# Patient Record
Sex: Female | Born: 1975
Health system: Southern US, Community
[De-identification: ages and names within clinical notes are randomized; demographics above are authoritative.]

## PROBLEM LIST (undated history)

## (undated) DIAGNOSIS — K219 Gastro-esophageal reflux disease without esophagitis: Secondary | ICD-10-CM

## (undated) DIAGNOSIS — R51 Headache: Secondary | ICD-10-CM

## (undated) DIAGNOSIS — F329 Major depressive disorder, single episode, unspecified: Secondary | ICD-10-CM

## (undated) DIAGNOSIS — M797 Fibromyalgia: Secondary | ICD-10-CM

## (undated) DIAGNOSIS — A419 Sepsis, unspecified organism: Secondary | ICD-10-CM

## (undated) DIAGNOSIS — R21 Rash and other nonspecific skin eruption: Secondary | ICD-10-CM

## (undated) DIAGNOSIS — R011 Cardiac murmur, unspecified: Secondary | ICD-10-CM

## (undated) DIAGNOSIS — F909 Attention-deficit hyperactivity disorder, unspecified type: Secondary | ICD-10-CM

## (undated) DIAGNOSIS — F419 Anxiety disorder, unspecified: Secondary | ICD-10-CM

## (undated) DIAGNOSIS — N809 Endometriosis, unspecified: Secondary | ICD-10-CM

## (undated) DIAGNOSIS — F32A Depression, unspecified: Secondary | ICD-10-CM

## (undated) DIAGNOSIS — R519 Headache, unspecified: Secondary | ICD-10-CM

## (undated) DIAGNOSIS — D649 Anemia, unspecified: Secondary | ICD-10-CM

## (undated) DIAGNOSIS — I83813 Varicose veins of bilateral lower extremities with pain: Secondary | ICD-10-CM

## (undated) DIAGNOSIS — J45909 Unspecified asthma, uncomplicated: Secondary | ICD-10-CM

## (undated) HISTORY — DX: Anemia, unspecified: D64.9

## (undated) HISTORY — DX: Endometriosis, unspecified: N80.9

## (undated) HISTORY — DX: Rash and other nonspecific skin eruption: R21

## (undated) HISTORY — PX: CHOLECYSTECTOMY: SHX55

## (undated) HISTORY — DX: Anxiety disorder, unspecified: F41.9

## (undated) HISTORY — DX: Major depressive disorder, single episode, unspecified: F32.9

## (undated) HISTORY — DX: Fibromyalgia: M79.7

## (undated) HISTORY — DX: Depression, unspecified: F32.A

## (undated) HISTORY — DX: Cardiac murmur, unspecified: R01.1

---

## 2005-07-15 ENCOUNTER — Emergency Department (HOSPITAL_COMMUNITY): Admission: EM | Admit: 2005-07-15 | Discharge: 2005-07-15 | Payer: Self-pay | Admitting: Family Medicine

## 2007-04-30 ENCOUNTER — Ambulatory Visit: Payer: Self-pay | Admitting: Family Medicine

## 2007-08-18 ENCOUNTER — Ambulatory Visit: Payer: Self-pay | Admitting: Family Medicine

## 2010-06-20 ENCOUNTER — Ambulatory Visit: Payer: Self-pay | Admitting: Unknown Physician Specialty

## 2010-07-18 ENCOUNTER — Ambulatory Visit: Payer: Self-pay | Admitting: Unknown Physician Specialty

## 2011-03-18 ENCOUNTER — Other Ambulatory Visit: Payer: Self-pay | Admitting: Internal Medicine

## 2011-04-29 ENCOUNTER — Ambulatory Visit: Payer: Self-pay | Admitting: Pain Medicine

## 2011-05-22 ENCOUNTER — Ambulatory Visit: Payer: Self-pay | Admitting: Pain Medicine

## 2011-06-02 ENCOUNTER — Ambulatory Visit: Payer: Self-pay | Admitting: Pain Medicine

## 2011-06-04 ENCOUNTER — Ambulatory Visit: Payer: Self-pay | Admitting: Pain Medicine

## 2011-06-17 ENCOUNTER — Ambulatory Visit: Payer: Self-pay | Admitting: Pain Medicine

## 2011-07-09 ENCOUNTER — Ambulatory Visit: Payer: Self-pay | Admitting: Pain Medicine

## 2011-08-05 ENCOUNTER — Ambulatory Visit: Payer: Self-pay | Admitting: Pain Medicine

## 2011-08-20 ENCOUNTER — Ambulatory Visit: Payer: Self-pay | Admitting: Pain Medicine

## 2011-09-02 ENCOUNTER — Ambulatory Visit: Payer: Self-pay | Admitting: Pain Medicine

## 2011-09-30 ENCOUNTER — Ambulatory Visit: Payer: Self-pay | Admitting: Pain Medicine

## 2011-10-30 ENCOUNTER — Ambulatory Visit: Payer: Self-pay | Admitting: Pain Medicine

## 2011-12-01 ENCOUNTER — Ambulatory Visit: Payer: Self-pay | Admitting: Pain Medicine

## 2011-12-15 ENCOUNTER — Ambulatory Visit: Payer: Self-pay | Admitting: Pain Medicine

## 2011-12-30 ENCOUNTER — Ambulatory Visit: Payer: Self-pay | Admitting: Pain Medicine

## 2012-01-27 ENCOUNTER — Ambulatory Visit: Payer: Self-pay | Admitting: Pain Medicine

## 2012-02-06 ENCOUNTER — Ambulatory Visit: Payer: Self-pay | Admitting: Pain Medicine

## 2012-02-25 ENCOUNTER — Ambulatory Visit: Payer: Self-pay | Admitting: Pain Medicine

## 2012-03-23 ENCOUNTER — Ambulatory Visit: Payer: Self-pay | Admitting: Pain Medicine

## 2012-04-26 ENCOUNTER — Ambulatory Visit: Payer: Self-pay | Admitting: Pain Medicine

## 2012-05-27 ENCOUNTER — Ambulatory Visit: Payer: Self-pay | Admitting: Pain Medicine

## 2012-06-28 ENCOUNTER — Ambulatory Visit: Payer: Self-pay | Admitting: Pain Medicine

## 2012-07-27 ENCOUNTER — Ambulatory Visit: Payer: Self-pay | Admitting: Pain Medicine

## 2012-08-26 ENCOUNTER — Ambulatory Visit: Payer: Self-pay | Admitting: Pain Medicine

## 2012-09-23 ENCOUNTER — Ambulatory Visit: Payer: Self-pay | Admitting: Pain Medicine

## 2012-10-25 ENCOUNTER — Ambulatory Visit: Payer: Self-pay | Admitting: Pain Medicine

## 2012-11-23 ENCOUNTER — Ambulatory Visit: Payer: Self-pay | Admitting: Pain Medicine

## 2012-12-23 ENCOUNTER — Ambulatory Visit: Payer: Self-pay | Admitting: Pain Medicine

## 2013-01-18 ENCOUNTER — Ambulatory Visit: Payer: Self-pay | Admitting: Pain Medicine

## 2013-02-17 ENCOUNTER — Ambulatory Visit: Payer: Self-pay | Admitting: Pain Medicine

## 2013-03-23 ENCOUNTER — Ambulatory Visit: Payer: Self-pay | Admitting: Pain Medicine

## 2013-04-20 ENCOUNTER — Ambulatory Visit: Payer: Self-pay | Admitting: Pain Medicine

## 2013-05-16 ENCOUNTER — Ambulatory Visit: Payer: Self-pay | Admitting: Pain Medicine

## 2013-06-20 ENCOUNTER — Ambulatory Visit: Payer: Self-pay | Admitting: Pain Medicine

## 2013-07-20 ENCOUNTER — Ambulatory Visit: Payer: Self-pay | Admitting: Pain Medicine

## 2013-08-16 ENCOUNTER — Ambulatory Visit: Payer: Self-pay | Admitting: Pain Medicine

## 2013-08-30 ENCOUNTER — Ambulatory Visit: Payer: Self-pay | Admitting: Gastroenterology

## 2013-09-15 ENCOUNTER — Ambulatory Visit: Payer: Self-pay | Admitting: Pain Medicine

## 2013-10-18 ENCOUNTER — Ambulatory Visit: Payer: Self-pay | Admitting: Pain Medicine

## 2013-11-03 ENCOUNTER — Ambulatory Visit: Payer: Self-pay | Admitting: Surgery

## 2013-11-05 LAB — PATHOLOGY REPORT

## 2013-11-08 ENCOUNTER — Ambulatory Visit: Payer: Self-pay | Admitting: Pain Medicine

## 2013-12-08 ENCOUNTER — Ambulatory Visit: Payer: Self-pay | Admitting: Pain Medicine

## 2014-01-05 ENCOUNTER — Ambulatory Visit: Payer: Self-pay | Admitting: Pain Medicine

## 2014-02-06 ENCOUNTER — Ambulatory Visit: Payer: Self-pay | Admitting: Pain Medicine

## 2014-02-16 ENCOUNTER — Inpatient Hospital Stay: Payer: Self-pay | Admitting: Internal Medicine

## 2014-02-16 LAB — CBC
HCT: 33.1 % — ABNORMAL LOW (ref 35.0–47.0)
HGB: 10.9 g/dL — ABNORMAL LOW (ref 12.0–16.0)
MCH: 29 pg (ref 26.0–34.0)
MCHC: 32.7 g/dL (ref 32.0–36.0)
MCV: 89 fL (ref 80–100)
PLATELETS: 281 10*3/uL (ref 150–440)
RBC: 3.75 10*6/uL — ABNORMAL LOW (ref 3.80–5.20)
RDW: 16 % — AB (ref 11.5–14.5)
WBC: 21 10*3/uL — ABNORMAL HIGH (ref 3.6–11.0)

## 2014-02-16 LAB — COMPREHENSIVE METABOLIC PANEL
Albumin: 2.8 g/dL — ABNORMAL LOW (ref 3.4–5.0)
Alkaline Phosphatase: 189 U/L — ABNORMAL HIGH
Anion Gap: 9 (ref 7–16)
BUN: 13 mg/dL (ref 7–18)
Bilirubin,Total: 0.3 mg/dL (ref 0.2–1.0)
CALCIUM: 8.1 mg/dL — AB (ref 8.5–10.1)
Chloride: 103 mmol/L (ref 98–107)
Co2: 24 mmol/L (ref 21–32)
Creatinine: 0.97 mg/dL (ref 0.60–1.30)
EGFR (African American): >60
EGFR (Non-African Amer.): >60
GLUCOSE: 104 mg/dL — AB (ref 65–99)
Osmolality: 272 (ref 275–301)
POTASSIUM: 2.7 mmol/L — AB (ref 3.5–5.1)
SGOT(AST): 22 U/L (ref 15–37)
SGPT (ALT): 21 U/L
Sodium: 136 mmol/L (ref 136–145)
Total Protein: 7 g/dL (ref 6.4–8.2)

## 2014-02-16 LAB — URINALYSIS, COMPLETE
Bilirubin,UR: NEGATIVE
Glucose,UR: NEGATIVE mg/dL (ref 0–75)
Nitrite: NEGATIVE
Ph: 5 (ref 4.5–8.0)
Protein: 30
RBC,UR: 8 /HPF (ref 0–5)
Specific Gravity: 1.014 (ref 1.003–1.030)
WBC UR: 18 /HPF (ref 0–5)

## 2014-02-17 LAB — BASIC METABOLIC PANEL
ANION GAP: 6 — AB (ref 7–16)
BUN: 10 mg/dL (ref 7–18)
CALCIUM: 7.6 mg/dL — AB (ref 8.5–10.1)
CO2: 24 mmol/L (ref 21–32)
Chloride: 110 mmol/L — ABNORMAL HIGH (ref 98–107)
Creatinine: 1.02 mg/dL (ref 0.60–1.30)
EGFR (African American): >60
EGFR (Non-African Amer.): >60
Glucose: 114 mg/dL — ABNORMAL HIGH (ref 65–99)
Osmolality: 279 (ref 275–301)
Potassium: 3.2 mmol/L — ABNORMAL LOW (ref 3.5–5.1)
Sodium: 140 mmol/L (ref 136–145)

## 2014-02-17 LAB — CBC WITH DIFFERENTIAL/PLATELET
Basophil #: 0 10*3/uL (ref 0.0–0.1)
Basophil %: 0.1 %
Eosinophil #: 0 10*3/uL (ref 0.0–0.7)
Eosinophil %: 0 %
HCT: 31.1 % — ABNORMAL LOW (ref 35.0–47.0)
HGB: 9.6 g/dL — ABNORMAL LOW (ref 12.0–16.0)
Lymphocyte #: 1 10*3/uL (ref 1.0–3.6)
Lymphocyte %: 6.3 %
MCH: 28.1 pg (ref 26.0–34.0)
MCHC: 31 g/dL — AB (ref 32.0–36.0)
MCV: 91 fL (ref 80–100)
MONO ABS: 1.4 x10 3/mm — AB (ref 0.2–0.9)
MONOS PCT: 9.5 %
Neutrophil #: 12.8 10*3/uL — ABNORMAL HIGH (ref 1.4–6.5)
Neutrophil %: 84.1 %
Platelet: 223 10*3/uL (ref 150–440)
RBC: 3.43 10*6/uL — ABNORMAL LOW (ref 3.80–5.20)
RDW: 16.3 % — ABNORMAL HIGH (ref 11.5–14.5)
WBC: 15.2 10*3/uL — ABNORMAL HIGH (ref 3.6–11.0)

## 2014-02-17 LAB — RAPID INFLUENZA A&B ANTIGENS

## 2014-02-18 LAB — URINE CULTURE

## 2014-02-21 LAB — CULTURE, BLOOD (SINGLE)

## 2014-03-09 ENCOUNTER — Ambulatory Visit: Payer: Self-pay | Admitting: Pain Medicine

## 2014-04-10 ENCOUNTER — Ambulatory Visit: Payer: Self-pay | Admitting: Pain Medicine

## 2014-05-09 ENCOUNTER — Ambulatory Visit: Payer: Self-pay | Admitting: Pain Medicine

## 2014-06-08 ENCOUNTER — Ambulatory Visit: Payer: Self-pay | Admitting: Pain Medicine

## 2014-07-04 ENCOUNTER — Ambulatory Visit: Payer: Self-pay | Admitting: Pain Medicine

## 2014-08-03 ENCOUNTER — Ambulatory Visit: Payer: Self-pay | Admitting: Pain Medicine

## 2014-09-05 ENCOUNTER — Ambulatory Visit
Admit: 2014-09-05 | Disposition: A | Discharge status: Home / Self Care | Payer: Self-pay | Attending: Pain Medicine | Admitting: Pain Medicine

## 2014-09-09 NOTE — Discharge Summary (Signed)
PATIENT NAME:  Isabel Robinson, Isabel Robinson MR#:  [id number] DATE OF BIRTH:  [birth year]  DATE OF ADMISSION:  02/16/2014 DATE OF DISCHARGE:  02/18/2014  ADMISSION DIAGNOSIS: Sepsis secondary to urinary tract infection.   DISCHARGE DIAGNOSES: 1. Sepsis secondary to right-sided pyelonephritis, acute.  2. Right-sided acute pyelonephritis. 3. Fibromyalgia. 4. Anxiety and depression.   CONSULTATIONS: None.   PERTINENT LABORATORIES: Blood and urine cultures are negative to date.   HOSPITAL COURSE: A 39 year old female who presented with chills and fever. For further details, please refer to the H and P.  1. Sepsis. The patient was admitted for fevers and chills. She had a UA performed at urgent care, which was positive. At the urgent care, she was given antibiotics. She came to our ER due to persistent fevers. She was noted to have sepsis with tachycardia and fevers on admission. This is secondary to acute pyelonephritis. Her blood cultures and urine culture are negative to date; however, she had received antibiotics prior to these cultures taken that she received in the urgent care.  2. Acute pyelonephritis. I expect a few days of fevers, despite adequate treatment. We are unable to get a urine culture. She was on Rocephin here and will be discharged with p.o. Augmentin.  3. Fibromyalgia. The patient can follow up with Dr. Metta Clinesrisp.  4. Anxiety or depression. The patient will continue her home medications.   DISCHARGE MEDICATIONS: 1. Topamax 25 mg b.i.d.  2. Tramadol 50 mg every 4 hours p.r.n.   3. Gabapentin 100 mg 2 tablets b.i.d.  4. Omeprazole 20 mg daily.  5. Acetaminophen/oxycodone 325/7.5 every 4 to 8 hours p.r.n. pain.  6. Orphenadrine 100 mg 1 to 2 tablets p.r.n. pain.  7. Effexor 75 mg b.i.d.  8. Diazepam 5 mg b.i.d.  9. Augmentin 1 tablet p.o. b.i.d. x 11 days.  10. Ambien 5 mg at bedtime p.r.n.   DISCHARGE DIET: Regular diet.   DISCHARGE ACTIVITY: As tolerated.   DISCHARGE FOLLOWUP: The  patient will follow up in 2 weeks with Dr. Juel BurrowMasoud, after completion of her antibiotic.   TIME SPENT: Approximately 35 minutes. The patient was stable for discharge.     ____________________________ Geordie Nooney P. Juliene PinaMody, MD spm:TT D: 02/18/2014 11:57:10 ET T: 02/18/2014 19:41:34 ET JOB#: [id number]  cc: Pearlena Ow P. Juliene PinaMody, MD, <Dictator> Ancil LinseyGregory H. Crisp, MD Corky DownsJaved Masoud, MD Janyth ContesSITAL P Annabella Elford MD ELECTRONICALLY SIGNED 02/18/2014 20:33

## 2014-09-09 NOTE — Op Note (Signed)
PATIENT NAME:  Isabel Robinson, Isabel Robinson DATE OF BIRTH:  1976-04-21  DATE OF PROCEDURE:  11/03/2013  PREOPERATIVE DIAGNOSIS: Cholecystitis and cholelithiasis.   POSTOPERATIVE DIAGNOSIS: Cholecystitis and cholelithiasis.   OPERATION: Robotic-assisted laparoscopic cholecystectomy, single site.  SURGEON: Quentin Orealph L. Ely III, MD  ANESTHESIA: General.  OPERATIVE PROCEDURE: With the patient in the supine position, after the induction of the appropriate general anesthesia, the patient's abdomen was prepped with ChloraPrep and draped with sterile towels. An umbilical incision was made from below the umbilicus to above the umbilicus at approximately 30 mm of skin incision. The incision was taken down to the fascia, which was divided with Bovie electrocautery. The peritoneum was identified and opened bluntly and then extended the length of the fascial incision. About 25 mm of fascial incision was encountered. The robot port was then inserted without difficulty. The abdomen was insufflated and the camera inserted through the camera port and the abdomen inspected. The gallbladder appeared to be chronically diseased. The camera ports 2 and 1 were inserted under direct vision. The robot was then brought to the table and appropriately docked, instruments inserted under direct vision. Using the assistance port, the gallbladder was lifted superiorly and laterally. Dissection was carried along the hepatoduodenal ligament. The cystic artery and cystic duct were identified. The cystic duct was clipped and divided as was the cystic artery. The gallbladder was then dissected free using the hook and cautery apparatus. Once the gallbladder was free, the camera was used to remove the ports under direct vision, and then the port site and gallbladder removed as 1 piece. The midline fascia was closed with interrupted figure-of-eight suture of 0 Maxon. The skin was closed with 5-0 nylon in vertical mattress fashion. The area  was injected with 0.25% Marcaine for postoperative pain control. Sterile dressings were applied. The patient was returned to the recovery room, having tolerated the procedure well. Sponge, instrument and needle counts were correct x2 in the operating room.   ____________________________ Quentin Orealph L. Ely III, MD rle:lb D: 11/03/2013 11:23:37 ET T: 11/03/2013 11:29:55 ET JOB#: [id number]  cc: Quentin Orealph L. Ely III, MD, <Dictator> Corky DownsJaved Masoud, MD Quentin OreALPH L ELY MD ELECTRONICALLY SIGNED 11/04/2013 18:13

## 2014-09-09 NOTE — H&P (Signed)
PATIENT NAME:  Isabel Robinson, Isabel Robinson MR#:  [id number] DATE OF BIRTH:  Oct 29, 1975  DATE OF ADMISSION:  02/16/2014  PRIMARY CARE PHYSICIAN:    REFERRING PHYSICIAN:  Cecille Amsterdam. Mayford Knife, MD  CHIEF COMPLAINT:  Flank pain, fever, generalized body aches.   HISTORY OF PRESENT ILLNESS:  Ms. Isabel Robinson is a 39 year old female who has history of fibromyalgia and neuropathy, who came to the Emergency Department with the above complaints. The patient states she had heavy menstruation about a week back and about 2 days later started to experience severe generalized weakness and generalized body aches. Since last night, the patient noticed to have fever of 103. She has some dysuria, abdominal pain, and back pain. Concerning this, she came to the Emergency Department.   WORKUP IN THE EMERGENCY DEPARTMENT:  The patient was found to have a urinary tract infection. The patient was found to have fever of 101.3. The patient was also found to have elevated white blood cell count of 16,000. UA shows trace leukocyte esterase and WBC of 18. Urine cultures have been obtained. The patient was given Rocephin in the Emergency Department.  PAST MEDICAL HISTORY:   1.  Anemia. 2.  History of asthma. 3.  Depression. 4.  Anxiety. 5.  Migraine headaches. 6.  Fibromyalgia.  PAST SURGICAL HISTORY:  Colonoscopy and cholecystectomy.  ALLERGIES:  No known drug allergies.  HOME MEDICATIONS:   1.  Venlafaxine 75 mg 2 times a day. 2.  Tramadol 50 mg every 4 hours as needed. 3.   25 mg 2 times a day. 4.   100 mg 2 times a day. 5.  Omeprazole 20 mg 2 times a day. 6.  Gabapentin 100 mg 2 times a day. 7.   5 mg 2 times a day. 8.  Percocet 7.5/325 mg every 4 to 8 hours as needed.  SOCIAL HISTORY:  Quit smoking about 3 months back. Denies drinking alcohol or using illicit drugs. Works as a Chartered loss adjuster. Married and lives with her husband.  FAMILY HISTORY:  Borderline diabetes mellitus.  REVIEW OF SYSTEMS: CONSTITUTIONAL:   Experiencing generalized weakness. EYES:  No change in vision. EARS, NOSE, AND THROAT:  No change in hearing. RESPIRATORY:  No cough or shortness of breath. CARDIOVASCULAR:  No chest pain or palpitations.  GASTROINTESTINAL:  There is no nausea, vomiting, or abdominal pain. GENITOURINARY:  Has frequency of urination and mild dysuria.  SKIN:  No rash or lesions. MUSCULOSKELETAL:  No joint pains. NEUROLOGICAL:  No weakness or numbness in any part of the body.  PHYSICAL EXAMINATION: GENERAL:  This is a well-built, well-nourished, age-appropriate female lying down in the bed not in distress. VITAL SIGNS:  Temperature 101.2, pulse 107, blood pressure 99/52, respiratory rate 18, oxygen saturation 98% on room air. HEENT:  Normocephalic, atraumatic. There is no scleral icterus. Conjunctivae are normal. Pupils are equal and reactive. Mucous membranes are moist without any erythema.  NECK:  Supple. No lymphadenopathy.  No JVD. No carotid bruit. CHEST:  No focal tenderness. Decreased breath sounds. Bilaterally clear to auscultation.  HEART: . No murmurs are heard. ABDOMEN:  Bowel sounds are present. Soft, nontender, nondistended, mild tenderness in the suprapubic area. The patient states she has right CVA tenderness. No hepatosplenomegaly. EXTREMITIES:  No pedal edema. Pulses are 2+. NEUROLOGIC:  The patient is alert and oriented to place, person, and time. Cranial nerves II through XII are intact. Motor is 5/5 in upper and lower extremities.   LABORATORY DATA:  CBC: WBC of 21,000, hemoglobin 10.9, platelet  count of 289. CMP: Potassium of 2.7 and was otherwise within normal limits. UA:  Trace leukocyte esterase, WBCs of 18, 1+ bacteria, and was otherwise within normal limits.  ASSESSMENT AND PLAN: Ms. Isabel Robinson is a 39 year old female who comes in with sepsis secondary to urinary tract infection.  1.  Sepsis secondary to urinary tract infection. The patient has mild urinary tract infection, does not have  any focal signs. However, there is also a possibility that the patient may be having viral fever. Will continue with the Rocephin and follow up with blood and urine cultures. 2.  Urinary tract infection. Follow up with urine cultures. Continue the Rocephin. 3.  Hyperkalemia.  Replace by mouth. 4.  Fibromyalgia.  Continue the home medications. 5.  Keep the patient on deep vein thrombosis prophylaxis with Lovenox.  TIME SPENT:  50 minutes.   ____________________________ Susa GriffinsPadmaja Juel Bellerose, MD pv:nb D: 02/16/2014 23:31:29 ET T: 02/16/2014 23:41:17 ET JOB#: [id number]  cc: Susa GriffinsPadmaja Josua Ferrebee, MD, <Dictator> Susa GriffinsPADMAJA Shepherd Finnan MD ELECTRONICALLY SIGNED 02/17/2014 23:01

## 2014-09-14 ENCOUNTER — Other Ambulatory Visit: Payer: Self-pay | Admitting: Internal Medicine

## 2014-09-15 ENCOUNTER — Ambulatory Visit
Admit: 2014-09-15 | Disposition: A | Discharge status: Home / Self Care | Payer: Self-pay | Attending: Internal Medicine | Admitting: Internal Medicine

## 2014-09-28 ENCOUNTER — Telehealth: Payer: Self-pay

## 2014-09-28 NOTE — Telephone Encounter (Signed)
Patient has headache. Please have Olegario MessierKathy and Morrie Sheldonshley patient to Monday scheduled. May consider sphenopalatine ganglion block. Please call patient to inform patient to bring a driver since may perform procedure

## 2014-09-28 NOTE — Telephone Encounter (Signed)
Patient has had a migraine since Tuesday and would like to come in as soon as possible. She has an appointment next Thursday but would like to come in sooner due to her headache.

## 2014-10-02 ENCOUNTER — Encounter: Payer: Self-pay | Admitting: Pain Medicine

## 2014-10-02 ENCOUNTER — Ambulatory Visit: Payer: BC Managed Care – PPO | Attending: Pain Medicine | Admitting: Pain Medicine

## 2014-10-02 ENCOUNTER — Encounter (INDEPENDENT_AMBULATORY_CARE_PROVIDER_SITE_OTHER): Payer: Self-pay

## 2014-10-02 VITALS — BP 120/75 | HR 92 | Temp 98.2°F | Resp 16 | Ht 64.0 in | Wt 150.0 lb

## 2014-10-02 DIAGNOSIS — R51 Headache: Secondary | ICD-10-CM | POA: Diagnosis present

## 2014-10-02 DIAGNOSIS — G43909 Migraine, unspecified, not intractable, without status migrainosus: Secondary | ICD-10-CM | POA: Insufficient documentation

## 2014-10-02 DIAGNOSIS — M5481 Occipital neuralgia: Secondary | ICD-10-CM

## 2014-10-02 DIAGNOSIS — M7060 Trochanteric bursitis, unspecified hip: Secondary | ICD-10-CM | POA: Diagnosis not present

## 2014-10-02 DIAGNOSIS — G43009 Migraine without aura, not intractable, without status migrainosus: Secondary | ICD-10-CM

## 2014-10-02 DIAGNOSIS — M533 Sacrococcygeal disorders, not elsewhere classified: Secondary | ICD-10-CM

## 2014-10-02 MED ORDER — OXYCODONE HCL 10 MG PO TABS: ORAL_TABLET | ORAL | Status: DC

## 2014-10-02 NOTE — Progress Notes (Signed)
Discharged to home ambulatory with oxycodone prescription.  Patient instructed to follow up with neurologist.

## 2014-10-02 NOTE — Progress Notes (Signed)
   Subjective:    Patient ID: Isabel Robinson, female    DOB: 1975-11-17, 39 y.o.   MRN: [id number]  HPI  Patient is 39 year old female [facility] and treatment of pain consisting of headache. Patient states she has had migraine headache over the weekend which has began to the patient would plan to proceed with interventional treatment and will avoid interventional treatment at this time since patient's headache is beginning to the patient. The patient is understanding and in agreement with suggested treatment plan. We have recommended patient undergo neurological evaluation for further assessment of her headaches and we will continue medications as prescribed and will remain available to consider interventional treatment should the headache difficult degree. The patient was understanding and in agreement with suggested treatment plan  Review of Systems     Objective:   Physical Exam     there was tenderness of the apathy and occipital muscle region with no bounding pulsations of the temporal region noted. The face appeared symmetrical without ptosis of the eyelids. No excessive tends to palpation of the sinuses were noted. There was minimal tenderness of the temporomandibular joint region. Palpation of the queasiness levator scapula rhomboid musculature regions were with tenderness to palpation of mild degree with mild tenderness of the thoracic paraspinal musculature region palpation over the lumbar paraspinal musculature region was a tends to palpation of mild degree palpation of the lumbar facets was with mild tenderness to palpation leg raising was limited to approximately 30. There was tenderness of the PSIS and PII S region of mild degree. Entry deficit of dermatomal distribution of the lower extremities noted. Tinel's and Phalen's maneuver were without increase of pain of significant degree and patient was with bilaterally equal grip strength  abdomen nontender no costovertebral tenderness noted    Assessment  Migraine headache  Occipital neuralgia  Sacroiliac joint dysfunction  Greater trochanteric bursitis   Plan   Continue present medications Norflex and oxycodone  F/U PCP for evaliation of  BP and general medical  condition.  F/U surgical evaluation.  F/U nrurological evaluation.  May consider radiofrequency rhizolysis or intraspinal procedures pending response to present treatment and F/U evaluation.  [facility] to call [facility] should patient have concerns prior to scheduled return appointment.              Assessment & Plan:

## 2014-10-02 NOTE — Patient Instructions (Addendum)
Continue present medications.  F/U PCP for evaliation of  BP and general medical  condition.  F/U surgical evaluation.  F/U nrurological evaluation for eval of migraine and general neurological evaluation is discussed  May consider radiofrequency rhizolysis or intraspinal procedures pending response to present treatment and F/U evaluation.  Patient to call [facility] should patient have concerns prior to scheduled return appointment.

## 2014-10-02 NOTE — Progress Notes (Signed)
   Subjective:    Patient ID: Isabel Robinson, female    DOB: 1975/06/21, 39 y.o.   MRN: [id number]  HPI    Review of Systems     Objective:   Physical Exam        Assessment & Plan:

## 2014-10-05 ENCOUNTER — Encounter: Payer: Self-pay | Admitting: Pain Medicine

## 2014-10-31 ENCOUNTER — Ambulatory Visit: Payer: BC Managed Care – PPO | Attending: Pain Medicine | Admitting: Pain Medicine

## 2014-10-31 ENCOUNTER — Encounter: Payer: Self-pay | Admitting: Pain Medicine

## 2014-10-31 VITALS — BP 138/93 | HR 97 | Temp 98.9°F | Resp 18 | Ht 64.0 in | Wt 145.0 lb

## 2014-10-31 DIAGNOSIS — M533 Sacrococcygeal disorders, not elsewhere classified: Secondary | ICD-10-CM | POA: Diagnosis not present

## 2014-10-31 DIAGNOSIS — G43109 Migraine with aura, not intractable, without status migrainosus: Secondary | ICD-10-CM

## 2014-10-31 DIAGNOSIS — R51 Headache: Secondary | ICD-10-CM | POA: Diagnosis present

## 2014-10-31 DIAGNOSIS — M5481 Occipital neuralgia: Secondary | ICD-10-CM | POA: Diagnosis not present

## 2014-10-31 DIAGNOSIS — M5136 Other intervertebral disc degeneration, lumbar region: Secondary | ICD-10-CM | POA: Diagnosis not present

## 2014-10-31 DIAGNOSIS — M542 Cervicalgia: Secondary | ICD-10-CM | POA: Diagnosis present

## 2014-10-31 DIAGNOSIS — M7060 Trochanteric bursitis, unspecified hip: Secondary | ICD-10-CM | POA: Insufficient documentation

## 2014-10-31 MED ORDER — OXYCODONE HCL 10 MG PO TABS: ORAL_TABLET | ORAL | Status: DC

## 2014-10-31 NOTE — Progress Notes (Signed)
   Subjective:    Patient ID: Isabel Robinson, female    DOB: 1976/02/26, 39 y.o.   MRN: [id number]  HPI   Patient is 39 year old female [facility] evaluation and treatment of pain involving the region of the neck with headaches as well as lower back and lower extremity pain. Patient states that she has change of employment at this time and states that she already has felt the relief of stress after change of employment patient denies any other changes or trauma to call significant change in symptomatology. We discussed patient's condition and will continue presently prescribed medications at this time patient states the pain is fairly well-controlled at this time that she is looking forward to her new employment which seems to be more fitting for her. We will continue present medications and remain available to consider additional modification of treatment pending follow-up evaluation     Review of Systems     Objective:   Physical Exam   there was tenderness to palpation splenius capitis and occipitalis musculature region of mild to moderate degree no new lesions of the head and neck were noted. There was mild tends to palpation over the cervical facet cervical paraspinal musculature region and the thoracic facet thoracic paraspinal musculature region. No crepitus of the thoracic region was noted. Palpation over the region of the lower thoracic paraspinal musculature region was with evidence of muscle spasms of moderate degree with no crepitus of the thoracic region noted. There  Was tenderness to palpation of the paraspinal muscular region of the lumbar paraspinal musculature region lumbar facet region with extension and palpation of the lumbar facets reproducing moderate discomfort. Lateral bending and rotation was associated with moderate discomfort as well there was tennis over the PSIS PII S region gluteal and piriformis musculature regions. Straight leg  raising was tolerates approximately 30 without increased pain with dorsiflexion noted. There was negative clonus negative Homans. No definite sensory deficit of dermatomal disposition detected. Abdomen nontender and no costovertebral tenderness noted.     Assessment & Plan:   degenerative disc disease lumbar spine   Lumbar facet syndrome   Sacroiliac joint dysfunction   Lateral occipital neuralgia     Plan  Continue present medications. Oxycodone and Neurontin as well as Norflex  F/U PCP for evaliation of  BP and general medical  condition.  F/U surgical evaluation.  F/U neurological evaluation.  May consider radiofrequency rhizolysis or intraspinal procedures pending response to present treatment and F/U evaluation.  [facility] to call [facility] should patient have concerns prior to scheduled return appointment.

## 2014-10-31 NOTE — Progress Notes (Signed)
Safety precautions to be maintained throughout the outpatient stay will include: orient to surroundings, keep bed in low position, maintain call bell within reach at all times, provide assistance with transfer out of bed and ambulation.  

## 2014-10-31 NOTE — Patient Instructions (Addendum)
Continue present medications. Oxycodone, Norflex  F/U PCP for evaliation of  BP and general medical  condition.  F/U surgical evaluation.  F/U neurological evaluation.  May consider radiofrequency rhizolysis or intraspinal procedures pending response to present treatment and F/U evaluation.  Patient to call [facility] should patient have concerns prior to scheduled return appointment.

## 2014-10-31 NOTE — Progress Notes (Signed)
Discharged to home via ambulatory with script for oxycodone on hand.

## 2014-11-02 ENCOUNTER — Ambulatory Visit: Payer: BC Managed Care – PPO | Admitting: Pain Medicine

## 2014-11-22 ENCOUNTER — Ambulatory Visit: Payer: BC Managed Care – PPO | Attending: Pain Medicine | Admitting: Pain Medicine

## 2014-11-22 ENCOUNTER — Encounter: Payer: Self-pay | Admitting: Pain Medicine

## 2014-11-22 VITALS — BP 114/74 | HR 93 | Temp 98.2°F | Resp 18 | Ht 64.0 in | Wt 150.0 lb

## 2014-11-22 DIAGNOSIS — R51 Headache: Secondary | ICD-10-CM | POA: Diagnosis not present

## 2014-11-22 DIAGNOSIS — M533 Sacrococcygeal disorders, not elsewhere classified: Secondary | ICD-10-CM | POA: Diagnosis not present

## 2014-11-22 DIAGNOSIS — M545 Low back pain: Secondary | ICD-10-CM | POA: Diagnosis present

## 2014-11-22 DIAGNOSIS — M79605 Pain in left leg: Secondary | ICD-10-CM | POA: Diagnosis present

## 2014-11-22 DIAGNOSIS — M5481 Occipital neuralgia: Secondary | ICD-10-CM

## 2014-11-22 DIAGNOSIS — M5136 Other intervertebral disc degeneration, lumbar region: Secondary | ICD-10-CM | POA: Diagnosis not present

## 2014-11-22 DIAGNOSIS — M79604 Pain in right leg: Secondary | ICD-10-CM | POA: Diagnosis present

## 2014-11-22 DIAGNOSIS — M7060 Trochanteric bursitis, unspecified hip: Secondary | ICD-10-CM

## 2014-11-22 DIAGNOSIS — G43109 Migraine with aura, not intractable, without status migrainosus: Secondary | ICD-10-CM

## 2014-11-22 MED ORDER — DICLOFENAC EPOLAMINE 1.3 % TD PTCH
1.0000 | MEDICATED_PATCH | Freq: Two times a day (BID) | TRANSDERMAL | Status: DC
Start: 2014-11-22 — End: 2015-02-21

## 2014-11-22 MED ORDER — OXYCODONE HCL 10 MG PO TABS: ORAL_TABLET | ORAL | Status: DC

## 2014-11-22 NOTE — Patient Instructions (Addendum)
Continue present medications oxycodone and began Flector patch as discussed for your greater trochanteric bursitis  F/U PCP Dr. Harl BowieMassoud for evaliation of  BP and general medical  condition.  F/U surgical evaluation  F/U neurological evaluation  May consider radiofrequency rhizolysis or intraspinal procedures pending response to present treatment and F/U evaluation.  Patient to call [facility] should patient have concerns prior to scheduled return appointment.

## 2014-11-22 NOTE — Progress Notes (Signed)
Safety precautions to be maintained throughout the outpatient stay will include: orient to surroundings, keep bed in low position, maintain call bell within reach at all times, provide assistance with transfer out of bed and ambulation.  

## 2014-11-22 NOTE — Progress Notes (Signed)
Discharge patient home ambulatory 1411 hrs Scripts given  Oxycodone,teach back done

## 2014-11-22 NOTE — Progress Notes (Signed)
   Subjective:    Patient ID: Isabel Robinson, female    DOB: [birth year], 39 y.o.   MRN: [id number]  HPI  Patient is 39 year old female [facility] for further evaluation and treatment of pain involving the region of the lower back and lower extremity region predominantly. Patient also admits to mild headaches at time. Eyes trauma change in events of daily living the call significant change in symptomology. Patient is quite pleased overheard new employment. We discussed patient's condition and will continue medications as prescribed at this time. We will avoid interventional treatment and will remain available to consider modifications of treatment regimen as discussed and as explained to patient on today's visit. The patient was in agreement with suggested treatment plan.     Review of Systems     Objective:   Physical Exam  There was tends to over the splenius capitis of talus region of mild degree. There was mild tinnitus of the cervical facet cervical paraspinal muscular region. There was mild tinnitus of the trapezius levator scapula and rhomboid musculature regions. Palpation of the thoracic facet thoracic paraspinal muscles region was with mild tends to palpation. There was tenderness of the acromial clavicular glenohumeral joint region a mild degree. Patient appeared to be with bilaterally equal grip strength. Tinel and Phalen's maneuver were without increased pain of significant degree. Palpation over the lumbar paraspinal muscle lumbar facet region was a tends to palpation of mild degree. Lateral bending and rotation and extension and palpation of the lumbar facets reproduce moderate discomfort. There was moderate tenderness of the PSIS and PII S region. Palpation over the region of the region. Leg raising was tolerated to approximately 30 without increased pain with dorsiflexion noted no sensory deficit of dermatomal distribution detected negative clonus negative  Homans. Abdomen nontender and no costovertebral angle tenderness noted.    Assessment & Plan:  Degenerative disc disease lumbar spine  Lumbar facet syndrome  Sacroiliac joint dysfunction  Bilateral occipital neuralgia  Greater trochanteric bursitis   Plan  Continue present medication oxycodone   F/U PCP, Dr. Harl BowieMassoud,  for evaliation of  BP and general medical  condition.  F/U surgical evaluation  F/U neurological evaluation  May consider radiofrequency rhizolysis or intraspinal procedures pending response to present treatment and F/U evaluation.  [facility] to call [facility] should patient have concerns prior to scheduled return appointment.

## 2014-11-29 ENCOUNTER — Other Ambulatory Visit: Payer: Self-pay | Admitting: Pain Medicine

## 2014-11-30 ENCOUNTER — Encounter: Payer: BC Managed Care – PPO | Admitting: Pain Medicine

## 2014-12-26 ENCOUNTER — Ambulatory Visit: Payer: BLUE CROSS/BLUE SHIELD | Attending: Pain Medicine | Admitting: Pain Medicine

## 2014-12-26 ENCOUNTER — Encounter: Payer: Self-pay | Admitting: Pain Medicine

## 2014-12-26 VITALS — BP 128/78 | HR 97 | Temp 97.3°F | Resp 18 | Ht 64.0 in | Wt 150.0 lb

## 2014-12-26 DIAGNOSIS — M5136 Other intervertebral disc degeneration, lumbar region: Secondary | ICD-10-CM | POA: Insufficient documentation

## 2014-12-26 DIAGNOSIS — M542 Cervicalgia: Secondary | ICD-10-CM | POA: Diagnosis present

## 2014-12-26 DIAGNOSIS — M533 Sacrococcygeal disorders, not elsewhere classified: Secondary | ICD-10-CM | POA: Insufficient documentation

## 2014-12-26 DIAGNOSIS — M546 Pain in thoracic spine: Secondary | ICD-10-CM | POA: Diagnosis present

## 2014-12-26 DIAGNOSIS — M5481 Occipital neuralgia: Secondary | ICD-10-CM

## 2014-12-26 DIAGNOSIS — M7060 Trochanteric bursitis, unspecified hip: Secondary | ICD-10-CM | POA: Insufficient documentation

## 2014-12-26 DIAGNOSIS — R51 Headache: Secondary | ICD-10-CM | POA: Diagnosis present

## 2014-12-26 DIAGNOSIS — G43109 Migraine with aura, not intractable, without status migrainosus: Secondary | ICD-10-CM

## 2014-12-26 MED ORDER — ORPHENADRINE CITRATE ER 100 MG PO TB12: ORAL_TABLET | ORAL | Status: DC

## 2014-12-26 MED ORDER — OXYCODONE HCL 10 MG PO TABS: ORAL_TABLET | ORAL | Status: DC

## 2014-12-26 NOTE — Patient Instructions (Signed)
Continue present medications Neurontin Norflex Flector patch and oxycodone  F/U PCP Dr. Harl Bowie for evaliation of  BP and general medical  condition  F/U surgical evaluation  F/U neurological evaluation  May consider radiofrequency rhizolysis or intraspinal procedures pending response to present treatment and F/U evaluation   Patient to call [facility] should patient have concerns prior to scheduled return appointmen.

## 2014-12-26 NOTE — Progress Notes (Signed)
Safety precautions to be maintained throughout the outpatient stay will include: orient to surroundings, keep bed in low position, maintain call bell within reach at all times, provide assistance with transfer out of bed and ambulation.  

## 2014-12-26 NOTE — Progress Notes (Signed)
   Subjective:    Patient ID: Isabel Robinson, female    DOB: 1975/09/05, 39 y.o.   MRN: [id number]  HPI  Patient is 39 year old female [facility] and treatment of pain involving the neck with headaches as well as the upper mid and lower back and lower extremity regions. Patient has a new job and is quite pleased with the new job. Patient states that she is with pain fairly well-controlled at this time. We will avoid interventional treatment and continue presently prescribed medications. The patient will call [facility] should there be significant change in condition prior to scheduled return appointment and we will consider modifications of treatment regimen. The patient was understanding and agreement status treatment plan. Patient stated that she has pain occurring across the back as well as the hips     Review of Systems     Objective:   Physical Exam  There was tends to palpation of the splenius capitis and occipitalis musculature region. Palpation of these areas reproduced mild to moderate discomfort. There was tenderness over the trapezius levator scapula and rhomboid musculature regions of mild to moderate degree. No new lesions of the head and neck were noted. There was tenderness of the acromial clavicular glenohumeral joint region a mild degree. Patient was with bilaterally equal grip strength Tinel and Phalen's maneuver without increased pain of significant degree. Palpation over the thoracic paraspinal musculature region was with moderate tenderness to palpation of the lower thoracic region and no crepitus of the thoracic region was noted. Palpation over the lumbar paraspinal musculature region lumbar facet region was with mild to moderate discomfort. Extension and palpation of the lumbar facets reproduce mild to moderate discomfort. There was tends to palpation of the greater trochanteric region of mild to moderate degree  Straight leg raising was tolerates 30 without increased pain with dorsiflexion noted. There was negative clonus negative Homans. No definite sensory deficit of dermatomal distribution was detected. Abdomen was nontender and no costovertebral angle tenderness was noted.      Assessment & Plan:    Degenerative disc disease lumbar spine  Lumbar facet syndrome  Sacroiliac joint dysfunction  Bilateral occipital neuralgia  Greater trochanteric bursitis    Plan  Continue present medications Neurontin Norflex and oxycodone Dr. Harl Bowie  F/U PCP for evaliation of  BP and general medical  condition  F/U surgical evaluation  F/U neurological evaluation  May consider radiofrequency rhizolysis or intraspinal procedures pending response to present treatment and F/U evaluation   [facility] to [facility] should patient have concerns prior to scheduled return appointmen.

## 2015-01-25 ENCOUNTER — Encounter: Payer: Self-pay | Admitting: Pain Medicine

## 2015-01-25 ENCOUNTER — Ambulatory Visit: Payer: BLUE CROSS/BLUE SHIELD | Attending: Pain Medicine | Admitting: Pain Medicine

## 2015-01-25 VITALS — BP 137/82 | HR 84 | Temp 97.1°F | Resp 16 | Ht 64.0 in | Wt 150.0 lb

## 2015-01-25 DIAGNOSIS — M5481 Occipital neuralgia: Secondary | ICD-10-CM

## 2015-01-25 DIAGNOSIS — M797 Fibromyalgia: Secondary | ICD-10-CM | POA: Diagnosis not present

## 2015-01-25 DIAGNOSIS — M546 Pain in thoracic spine: Secondary | ICD-10-CM | POA: Diagnosis present

## 2015-01-25 DIAGNOSIS — M5136 Other intervertebral disc degeneration, lumbar region: Secondary | ICD-10-CM | POA: Insufficient documentation

## 2015-01-25 DIAGNOSIS — M7060 Trochanteric bursitis, unspecified hip: Secondary | ICD-10-CM | POA: Diagnosis not present

## 2015-01-25 DIAGNOSIS — M533 Sacrococcygeal disorders, not elsewhere classified: Secondary | ICD-10-CM | POA: Diagnosis not present

## 2015-01-25 DIAGNOSIS — G43109 Migraine with aura, not intractable, without status migrainosus: Secondary | ICD-10-CM

## 2015-01-25 DIAGNOSIS — M542 Cervicalgia: Secondary | ICD-10-CM | POA: Diagnosis present

## 2015-01-25 MED ORDER — OXYCODONE HCL 10 MG PO TABS: ORAL_TABLET | ORAL | Status: DC

## 2015-01-25 NOTE — Patient Instructions (Signed)
PLAN   Continue present medication Norflex and oxycodone and Flector patch  F/U PCP Dr. Harl Bowie for evaliation of  BP and general medical  condition  F/U surgical evaluation. May consider pending follow-up evaluations  F/U neurological evaluation. May consider pending follow-up evaluations  May consider radiofrequency rhizolysis or intraspinal procedures pending response to present treatment and F/U evaluation   [facility] to call [facility] should patient have concerns prior to scheduled return appointment.

## 2015-01-25 NOTE — Progress Notes (Signed)
   Subjective:    Patient ID: Isabel Robinson, female    DOB: [birth year], 39 y.o.   MRN: [id number]  HPI  Patient is 39 year old female [facility] evaluation and treatment of pain involving the neck and entire back upper and lower extremity regions and headache. On today's visit patient complained of just generalized soreness. Patient is with findings suggestive of fibromyalgia. We discussed patient's overall condition and will consider patient interventional treatment pending follow-up evaluation. Patient will follow-up Dr. Harl Bowie for further evaluation and treatment of general medical condition as discussed. Patient denies any trauma change in events of daily living the call significant change in symptomatology. Patient tolerating medications well without undesirable side effects. His medications as prescribed this time. We will remain available to consider patient interventional treatment as well as additional modification of treatment regimen pending follow-up evaluation. The patient was understanding and agreement with suggested treatment plan.   Review of Systems     Objective:   Physical Exam  There was tenderness of the splenius capitis and occipitalis musculature region of mild degree. There was mild tinnitus of the cervical facet cervical paraspinal muscles region. No new masses of the head and neck were noted. There was tennis over the region of the thoracic facet thoracic paraspinal muscles region of mild to moderate degree. Palpation of the acromial clavicular and glenohumeral joint region reproduced mild discomfort. Patient appeared to be with unremarkable Spurling's maneuver. There was tends to palpation over the lower thoracic region of mild to moderate degree.. There was no crepitus of the thoracic region noted. Palpation over the lumbar paraspinal muscles lumbar facet region associated with moderate discomfort. Lateral bending and rotation and  extension palpation of the lumbar facets reproduce moderate discomfort. There was moderate tends to palpation of the PSIS and PII S region. There was moderate to moderately severe tends to palpation of the trochanteric region iliotibial band region. Straight leg raising was tolerates approximately 30 without increase of pain with dorsiflexion noted. DTRs difficult to elicit. Trace at the knees. No sensory deficit of dermatomal distribution detected. Native clonus negative Homans. Moderate tennis of the gluteal and piriformis musculature regions. Negative clonus negative Homans. Abdomen nontender with no costovertebral angle tenderness noted.       Assessment & Plan:     Degenerative disc disease lumbar spine  Lumbar facet syndrome  Fibromyalgia  Sacroiliac joint dysfunction  Bilateral occipital neuralgia  Greater trochanteric bursitis     PLAN   Continue present medication Norflex oxycodone and Flector patch  F/U PCP Dr. Harl Bowie for evaliation of  BP and general medical  condition  F/U surgical evaluation. May consider pending follow-up evaluations  F/U neurological evaluation. May consider pending follow-up evaluations    Rheumatological evaluation to be considered pending follow-up evaluations  May consider radiofrequency rhizolysis or intraspinal procedures pending response to present treatment and F/U evaluation   [facility] to call [facility] should patient have concerns prior to scheduled return appointment.

## 2015-01-25 NOTE — Progress Notes (Signed)
Safety precautions to be maintained throughout the outpatient stay will include: orient to surroundings, keep bed in low position, maintain call bell within reach at all times, provide assistance with transfer out of bed and ambulation.  

## 2015-01-25 NOTE — Progress Notes (Signed)
UDS report on chart.  Reported to Dr Metta Clines

## 2015-02-21 ENCOUNTER — Ambulatory Visit: Payer: BLUE CROSS/BLUE SHIELD | Attending: Pain Medicine | Admitting: Pain Medicine

## 2015-02-21 ENCOUNTER — Encounter: Payer: Self-pay | Admitting: Pain Medicine

## 2015-02-21 VITALS — BP 109/76 | HR 104 | Temp 98.2°F | Resp 14 | Ht 64.0 in | Wt 150.0 lb

## 2015-02-21 DIAGNOSIS — M5136 Other intervertebral disc degeneration, lumbar region: Secondary | ICD-10-CM | POA: Diagnosis not present

## 2015-02-21 DIAGNOSIS — M7060 Trochanteric bursitis, unspecified hip: Secondary | ICD-10-CM

## 2015-02-21 DIAGNOSIS — R51 Headache: Secondary | ICD-10-CM | POA: Diagnosis present

## 2015-02-21 DIAGNOSIS — M533 Sacrococcygeal disorders, not elsewhere classified: Secondary | ICD-10-CM

## 2015-02-21 DIAGNOSIS — G43109 Migraine with aura, not intractable, without status migrainosus: Secondary | ICD-10-CM

## 2015-02-21 DIAGNOSIS — M797 Fibromyalgia: Secondary | ICD-10-CM | POA: Diagnosis not present

## 2015-02-21 DIAGNOSIS — M5481 Occipital neuralgia: Secondary | ICD-10-CM | POA: Diagnosis not present

## 2015-02-21 DIAGNOSIS — M542 Cervicalgia: Secondary | ICD-10-CM | POA: Diagnosis present

## 2015-02-21 DIAGNOSIS — M546 Pain in thoracic spine: Secondary | ICD-10-CM | POA: Diagnosis present

## 2015-02-21 DIAGNOSIS — G43009 Migraine without aura, not intractable, without status migrainosus: Secondary | ICD-10-CM

## 2015-02-21 MED ORDER — ORPHENADRINE CITRATE ER 100 MG PO TB12: ORAL_TABLET | ORAL | Status: DC

## 2015-02-21 MED ORDER — DICLOFENAC EPOLAMINE 1.3 % TD PTCH: 1.0000 | MEDICATED_PATCH | Freq: Two times a day (BID) | TRANSDERMAL | Status: DC

## 2015-02-21 MED ORDER — OXYCODONE HCL 10 MG PO TABS: ORAL_TABLET | ORAL | Status: DC

## 2015-02-21 NOTE — Progress Notes (Signed)
Safety precautions to be maintained throughout the outpatient stay will include: orient to surroundings, keep bed in low position, maintain call bell within reach at all times, provide assistance with transfer out of bed and ambulation.  

## 2015-02-21 NOTE — Patient Instructions (Addendum)
PLAN   Continue present medication Norflex and oxycodone and Flector patch  F/U PCP for evaliation of  BP and general medical  condition  F/U surgical evaluation. May consider pending follow-up evaluations  F/U neurological evaluation. May consider pending follow-up evaluations  TENS unit. Please obtain TENS unit as discussed   May consider radiofrequency rhizolysis or intraspinal procedures pending response to present treatment and F/U evaluation   [facility] to call [facility] should patient have concerns prior to scheduled return appointment. 

## 2015-02-21 NOTE — Progress Notes (Signed)
   Subjective:    Patient ID: Isabel Robinson, female    DOB: 27-Oct-1975, 39 y.o.   MRN: [id number]  HPI Patient is 39 year old female [facility] evaluation and treatment of pain involving headaches as well as pain involving the neck upper back mid back lower back and lower extremity regions. Patient states that the pain is fairly well-controlled at this time. Patient states that she has had some exacerbation of her pain after attending football games to watch her son play football. Patient stated that the games are during the late evenings and that the cold damp weather has caused some exacerbation of her symptoms. We discussed patient's condition and will prescribe TENS unit palpation at this time and continue medications as prescribed. The patient was understanding and in agreement with suggested treatment plan.      Review of Systems     Objective:   Physical Exam  There was tennis of the splenius capitis and occipitalis musculature regions of mild degree. There was mild tenderness of the cervical facet cervical paraspinal musculature region. There appeared to be unremarkable Spurling's maneuver and there was minimal tenderness to palpation of the acromioclavicular and glenohumeral joint regions.. Palpation over the thoracic facet thoracic paraspinal musculature region was with mild tenderness to palpation to moderate tends to palpation of the lower thoracic paraspinal musculature region. Lateral bending and rotation and extension and palpation over the lumbar facets reproduce moderate discomfort. There was tenderness over the greater trochanteric region iliotibial band region a moderate degree. No crepitus of the thoracic region was noted. Palpation over the lumbar paraspinal musculature region lumbar facet region was associated with moderate tends to palpation. Straight leg raising was tolerates approximately 30 without increased pain with dorsiflexion noted.  There was negative clonus negative Homans. EHL strength appeared to be equal without sensory deficit of dermatomal distribution detected. Abdomen nontender with no costovertebral angle tenderness noted     Assessment & Plan:    Degenerative disc disease lumbar spine  Lumbar facet syndrome  Fibromyalgia  Sacroiliac joint dysfunction  Bilateral occipital neuralgia  Greater trochanteric bursitis    PLAN   Continue present medication Norflex oxycodone and Flector patch  F/U PCP Dr. Harl Bowie for evaliation of  BP and general medical  condition  F/U surgical evaluation. May consider pending follow-up evaluations  F/U neurological evaluation. May consider pending follow-up evaluations  TENS unit prescribed for patient for treatment of arthralgias myalgias involving multiple regions  May consider radiofrequency rhizolysis or intraspinal procedures pending response to present treatment and F/U evaluation   [facility] to call [facility] should patient have concerns prior to scheduled return appointment.

## 2015-02-22 ENCOUNTER — Encounter: Payer: BLUE CROSS/BLUE SHIELD | Admitting: Pain Medicine

## 2015-03-13 ENCOUNTER — Other Ambulatory Visit: Payer: Self-pay | Admitting: Pain Medicine

## 2015-03-20 ENCOUNTER — Ambulatory Visit: Payer: BLUE CROSS/BLUE SHIELD | Attending: Pain Medicine | Admitting: Pain Medicine

## 2015-03-20 ENCOUNTER — Telehealth: Payer: Self-pay

## 2015-03-20 ENCOUNTER — Encounter: Payer: Self-pay | Admitting: Pain Medicine

## 2015-03-20 VITALS — BP 119/76 | HR 85 | Temp 98.0°F | Resp 15 | Ht 64.0 in | Wt 150.0 lb

## 2015-03-20 DIAGNOSIS — M7060 Trochanteric bursitis, unspecified hip: Secondary | ICD-10-CM | POA: Insufficient documentation

## 2015-03-20 DIAGNOSIS — G43109 Migraine with aura, not intractable, without status migrainosus: Secondary | ICD-10-CM

## 2015-03-20 DIAGNOSIS — M79605 Pain in left leg: Secondary | ICD-10-CM | POA: Diagnosis present

## 2015-03-20 DIAGNOSIS — G43009 Migraine without aura, not intractable, without status migrainosus: Secondary | ICD-10-CM

## 2015-03-20 DIAGNOSIS — M79604 Pain in right leg: Secondary | ICD-10-CM | POA: Diagnosis present

## 2015-03-20 DIAGNOSIS — M5481 Occipital neuralgia: Secondary | ICD-10-CM | POA: Diagnosis not present

## 2015-03-20 DIAGNOSIS — M545 Low back pain: Secondary | ICD-10-CM | POA: Diagnosis present

## 2015-03-20 DIAGNOSIS — M533 Sacrococcygeal disorders, not elsewhere classified: Secondary | ICD-10-CM | POA: Diagnosis not present

## 2015-03-20 DIAGNOSIS — M5136 Other intervertebral disc degeneration, lumbar region: Secondary | ICD-10-CM | POA: Diagnosis not present

## 2015-03-20 DIAGNOSIS — M797 Fibromyalgia: Secondary | ICD-10-CM | POA: Insufficient documentation

## 2015-03-20 MED ORDER — OXYCODONE HCL 10 MG PO TABS: ORAL_TABLET | ORAL | Status: DC

## 2015-03-20 MED ORDER — ORPHENADRINE CITRATE ER 100 MG PO TB12: ORAL_TABLET | ORAL | Status: DC

## 2015-03-20 NOTE — Progress Notes (Signed)
   Subjective:    Patient ID: Isabel Robinson, female    DOB: 1976/01/24, 39 y.o.   MRN: [id number]  HPI patient is 39 year old female who [facility] evaluation and treatment of pain involving the lower back and lower extremity region predominantly. Patient states the pain radiates from the lumbar region to the hips on the left as well as on the right. Patient also has complaint of bursitis. Patient states she has pain occurring in the back of the neck and shoulder region with pain radiating to the back of the head precipitating headaches at times. At the present time patient states the pain is fairly well controlled at present treatment regimen. Patient tolerating medications well without undesirable side effects. We will continue present medications at this time and avoid interventional treatment. The patient is in agreement with suggested treatment plan       Review of Systems     Objective:   Physical Exam There was tenderness of the splenius capitis and occipitalis musculature region a minimal degree. No masses of the head and neck were noted. There was minimal tenderness over the cervical facet cervical paraspinal musculature region and minimal tenderness over the thoracic facet thoracic paraspinal musculature region. Patient was with evidence of muscle spasms occurring in the trapezius muscle of mild to moderate degree. Patient appeared to be with unremarkable Spurling's maneuver and appeared to be with bilaterally equal grip strength. Tinel and Phalen's maneuver without increased pain of significant degree. Palpation of the lumbar paraspinal muscle lumbar facet region was a tends to palpation of mild to moderate degree. Lateral bending and rotation extension and palpation over the lumbar facets reproduce mild to moderate discomfort. There was mild tenderness over the PSIS and PII S region and moderate tenderness of the greater trochanteric region and  iliotibial band region which was palpated with patient in lateral decubitus position. Straight leg raising was tolerates approximately 20 without increased pain with dorsiflexion noted. There was no sensory deficit of dermatomal distribution. There was negative clonus and negative Homans. Abdomen nontender and no costovertebral angle tenderness noted              Assessment & Plan:     Degenerative disc disease lumbar spine  Lumbar facet syndrome  Fibromyalgia  Sacroiliac joint dysfunction  Bilateral occipital neuralgia  Greater trochanteric bursitis     PLAN   Continue present medication Norflex oxycodone and Flector patch  F/U PCP Dr. Harl BowieMassoud for evaliation of  BP and general medical  condition  F/U surgical evaluation. May consider pending follow-up evaluations  F/U neurological evaluation. May consider pending follow-up evaluations  TENS unit prescribed for patient for treatment of arthralgias myalgias involving multiple regions  May consider radiofrequency rhizolysis or intraspinal procedures pending response to present treatment and F/U evaluation   [facility] to call [facility] should patient have concerns prior to scheduled return appointment.

## 2015-03-20 NOTE — Telephone Encounter (Signed)
Nurses Please call the pharmacy regarding patient's prescription for oxycodone and see what we need to do

## 2015-03-20 NOTE — Telephone Encounter (Signed)
Pharmacy called. Per regulations patient may not fill until 03-22-15. Patient aware.

## 2015-03-20 NOTE — Progress Notes (Signed)
Safety precautions to be maintained throughout the outpatient stay will include: orient to surroundings, keep bed in low position, maintain call bell within reach at all times, provide assistance with transfer out of bed and ambulation.  

## 2015-03-20 NOTE — Patient Instructions (Signed)
PLAN   Continue present medication Norflex and oxycodone and Flector patch d F/U PCP Dr. Milderd MeagerMasou for evaliation of  BP and general medical  condition  F/U surgical evaluation. May consider pending follow-up evaluations  F/U neurological evaluation. May consider pending follow-up evaluations  TENS unit. Please obtain TENS unit as discussed   May consider radiofrequency rhizolysis or intraspinal procedures pending response to present treatment and F/U evaluation

## 2015-03-20 NOTE — Telephone Encounter (Signed)
Pharmacy will not fill her script until Thursday. They say its a state law. Can you call and tell them its ok to fill it. [facility] [address]

## 2015-03-26 ENCOUNTER — Encounter: Payer: BLUE CROSS/BLUE SHIELD | Admitting: Pain Medicine

## 2015-03-27 ENCOUNTER — Ambulatory Visit (INDEPENDENT_AMBULATORY_CARE_PROVIDER_SITE_OTHER): Payer: BLUE CROSS/BLUE SHIELD | Admitting: Obstetrics and Gynecology

## 2015-03-27 ENCOUNTER — Encounter: Payer: Self-pay | Admitting: Obstetrics and Gynecology

## 2015-03-27 ENCOUNTER — Encounter (INDEPENDENT_AMBULATORY_CARE_PROVIDER_SITE_OTHER): Payer: Self-pay

## 2015-03-27 VITALS — BP 130/85 | HR 102 | Ht 64.0 in | Wt 154.3 lb

## 2015-03-27 DIAGNOSIS — N946 Dysmenorrhea, unspecified: Secondary | ICD-10-CM

## 2015-03-27 DIAGNOSIS — N920 Excessive and frequent menstruation with regular cycle: Secondary | ICD-10-CM

## 2015-03-27 DIAGNOSIS — Z8489 Family history of other specified conditions: Secondary | ICD-10-CM | POA: Diagnosis not present

## 2015-03-27 DIAGNOSIS — R102 Pelvic and perineal pain unspecified side: Secondary | ICD-10-CM

## 2015-03-27 DIAGNOSIS — Z87410 Personal history of cervical dysplasia: Secondary | ICD-10-CM | POA: Diagnosis not present

## 2015-03-27 DIAGNOSIS — L731 Pseudofolliculitis barbae: Secondary | ICD-10-CM | POA: Diagnosis not present

## 2015-03-27 DIAGNOSIS — L739 Follicular disorder, unspecified: Secondary | ICD-10-CM

## 2015-03-27 DIAGNOSIS — Z842 Family history of other diseases of the genitourinary system: Secondary | ICD-10-CM

## 2015-03-27 MED ORDER — SULFAMETHOXAZOLE-TRIMETHOPRIM 800-160 MG PO TABS: 1.0000 | ORAL_TABLET | Freq: Two times a day (BID) | ORAL | Status: DC

## 2015-03-27 NOTE — Patient Instructions (Signed)
1.  History and physical exam is suspicious for endometriosis. 2.  Laparoscopy with biopsies, assess for  Endometriosis is scheduled. 3.  Take Septra DS twice a day for 10 days for vulvar folliculitis. 4.  Return for preop appointment prior to surgery

## 2015-03-27 NOTE — Progress Notes (Signed)
Patient ID: Isabel FudgeStacey S Kasprzak, female   DOB: [birth year], 39 y.o.   MRN: [id number] Pos hpv past  S/p cryotherapy- 5568yrs ago Bottom sore and tender- 4 d ? gential warts Needs AE

## 2015-03-27 NOTE — Progress Notes (Signed)
Patient ID: Isabel Robinson, female   DOB: 09-05-1975, 39 y.o.   MRN: [id number]  GYN ENCOUNTER NOTE  Subjective:       Isabel Robinson is a 39 y.o. 41P1001 female is here for gynecologic evaluation of the following issues:  1. Vulvovaginal soreness 5 days. 2.  Low back pain 48 hours. 3.  Pelvic cramping 48 hours.  The patient is a 39 year old married white female, para 1, 001, using vasectomy for contraception, who presents for evaluation of above complaints.   Gynecologic History Menarche age 39. Intervals are monthly. Duration of flow 7-8 days. History of dysmenorrhea, severe.  The first day, requiring 4 Advil 4 times a day. Dysmenorrhea is notable for low back pain and central cramping with the sense that her bottom is falling out. No history of deep thrusting dyspareunia. History of menorrhagia with clotting. Mom has endometriosis   Obstetric History OB History  Gravida Para Term Preterm AB SAB TAB Ectopic Multiple Living  1 1 1       1     # Outcome Date GA Lbr Len/2nd Weight Sex Delivery Anes PTL Lv  1 Term 2000   8 lb 1.9 oz (3.683 kg) M Vag-Spont   Y      Past Medical History  Diagnosis Date  . Depression   . Anemia   . Anxiety   . Heart murmur   . Fibromyalgia   . Rash     scalp    Past Surgical History  Procedure Laterality Date  . Cholecystectomy      Current Outpatient Prescriptions on File Prior to Visit  Medication Sig Dispense Refill  . diazepam (VALIUM) 5 MG tablet Take 5 mg by mouth 2 (two) times daily as needed for anxiety.    . diclofenac (FLECTOR) 1.3 % PTCH Place 1 patch onto the skin 2 (two) times daily. 60 patch 0  . gabapentin (NEURONTIN) 100 MG capsule Take 100 mg by mouth 2 (two) times daily.    Marland Kitchen. omeprazole (PRILOSEC) 20 MG capsule Take 20 mg by mouth 2 (two) times daily before a meal.    . orphenadrine (NORFLEX) 100 MG tablet Limit one tablet by mouth per day or twice per day if tolerated 60 tablet 2  . Oxycodone HCl 10 MG TABS  Limit 1 tab by mouth 4-6 times per day if tolerated 180 tablet 0  . topiramate (TOPAMAX) 25 MG capsule Take 25 mg by mouth 2 (two) times daily.    Marland Kitchen. venlafaxine (EFFEXOR) 75 MG tablet Take 75 mg by mouth. 2 tablets bid     No current facility-administered medications on file prior to visit.    No Known Allergies  Social History   Social History  . Marital Status: Married    Spouse Name: N/A  . Number of Children: N/A  . Years of Education: N/A   Occupational History  . Not on file.   Social History Main Topics  . Smoking status: Former Games developermoker  . Smokeless tobacco: Not on file  . Alcohol Use: 0.0 oz/week    0 Standard drinks or equivalent per week     Comment: occasionally  . Drug Use: No  . Sexual Activity: Not Currently    Birth Control/ Protection: Other-see comments     Comment: vasectomy   Other Topics Concern  . Not on file   Social History Narrative    Family History  Problem Relation Age of Onset  . Depression Mother   . Arthritis Mother   .  Hypertension Mother   . Endometriosis Mother   . Heart disease Father   . Deep vein thrombosis Father   . Hypertension Father   . Diabetes Father   . Ovarian cancer Neg Hx   . Breast cancer Neg Hx     The following portions of the patient's history were reviewed and updated as appropriate: allergies, current medications, past family history, past medical history, past social history, past surgical history and problem list.  Review of Systems Review of Systems - General ROS: negative for - chills, fatigue, fever, hot flashes, malaise or night sweats Hematological and Lymphatic ROS: negative for - bleeding problems or swollen lymph nodes Gastrointestinal ROS: negative for - abdominal pain, blood in stools, change in bowel habits and nausea/vomiting Musculoskeletal ROS: negative for - joint pain, muscle pain or muscular weakness Genito-Urinary ROS: negative for - change in menstrual cycle, dysmenorrhea, dyspareunia,  dysuria, genital discharge, genital ulcers, hematuria, incontinence, irregular/heavy menses, nocturia or pelvic painjj  Objective:   BP 130/85 mmHg  Pulse 102  Ht  (1.626 m)  Wt 154 lb 4.8 oz (69.99 kg)  BMI 26.47 kg/m2  LMP 03/01/2015 (Exact Date) CONSTITUTIONAL: Well-developed, well-nourished female in no acute distress.  HENT:  Normocephalic, atraumatic.  NECK: Normal range of motion, supple, no masses.  Normal thyroid.  SKIN: Skin is warm and dry. No rash noted. Not diaphoretic. No erythema. No pallor. NEUROLGIC: Alert and oriented to person, place, and time. PSYCHIATRIC: Normal mood and affect. Normal behavior. Normal judgment and thought content. CARDIOVASCULAR:Not Examined RESPIRATORY: Not Examined BREASTS: Not Examined ABDOMEN: Soft, non distended; Non tender.  No Organomegaly. PELVIC:  External Genitalia: Folliculitis of left labia majora  BUS: Normal  Vagina: Normal  Cervix: Normal; Mild cervical motion tenderness  Uterus: Normal size, shape,consistency, mobile; 2/4 tender  Adnexa: Normal; No palpable masses  RV: Normal External exam  Bladder: Nontender MUSCULOSKELETAL: Normal range of motion. No tenderness.  No cyanosis, clubbing, or edema.     Assessment:   1.  Left labia majora folliculitis. 2.  Family history of endometriosis. 3.  Dysmenorrhea, menorrhagia, and pelvic pain, likely consistent with endometriosis 4.  History of fibromyalgia. 5.  History of cervical dysplasia, status post cryosurgery   Plan:   1.  Septra DS twice a day for 10 days. 2.  Laparoscopy and endometriosis ACOG pamphlets given 3.  Laparoscopy with peritoneal biopsy scheduled. 4.  Return in 3 weeks for preop appointment  Herold Harms, MD  Note: This dictation was prepared with Dragon dictation along with smaller phrase technology. Any transcriptional errors that result from this process are unintentional.

## 2015-04-02 DIAGNOSIS — N920 Excessive and frequent menstruation with regular cycle: Secondary | ICD-10-CM | POA: Insufficient documentation

## 2015-04-02 DIAGNOSIS — G609 Hereditary and idiopathic neuropathy, unspecified: Secondary | ICD-10-CM | POA: Insufficient documentation

## 2015-04-02 DIAGNOSIS — R519 Headache, unspecified: Secondary | ICD-10-CM | POA: Insufficient documentation

## 2015-04-02 DIAGNOSIS — D649 Anemia, unspecified: Secondary | ICD-10-CM | POA: Insufficient documentation

## 2015-04-02 DIAGNOSIS — G5600 Carpal tunnel syndrome, unspecified upper limb: Secondary | ICD-10-CM | POA: Insufficient documentation

## 2015-04-02 DIAGNOSIS — N946 Dysmenorrhea, unspecified: Secondary | ICD-10-CM | POA: Insufficient documentation

## 2015-04-02 DIAGNOSIS — Z87410 Personal history of cervical dysplasia: Secondary | ICD-10-CM | POA: Insufficient documentation

## 2015-04-02 DIAGNOSIS — R51 Headache: Secondary | ICD-10-CM

## 2015-04-18 ENCOUNTER — Encounter: Payer: Self-pay | Admitting: Pain Medicine

## 2015-04-18 ENCOUNTER — Ambulatory Visit: Payer: BLUE CROSS/BLUE SHIELD | Attending: Pain Medicine | Admitting: Pain Medicine

## 2015-04-18 VITALS — BP 133/67 | HR 86 | Temp 97.6°F | Resp 16 | Ht 64.0 in | Wt 150.0 lb

## 2015-04-18 DIAGNOSIS — M533 Sacrococcygeal disorders, not elsewhere classified: Secondary | ICD-10-CM | POA: Insufficient documentation

## 2015-04-18 DIAGNOSIS — N809 Endometriosis, unspecified: Secondary | ICD-10-CM | POA: Insufficient documentation

## 2015-04-18 DIAGNOSIS — M7060 Trochanteric bursitis, unspecified hip: Secondary | ICD-10-CM | POA: Diagnosis not present

## 2015-04-18 DIAGNOSIS — M797 Fibromyalgia: Secondary | ICD-10-CM | POA: Insufficient documentation

## 2015-04-18 DIAGNOSIS — M5136 Other intervertebral disc degeneration, lumbar region: Secondary | ICD-10-CM | POA: Insufficient documentation

## 2015-04-18 DIAGNOSIS — M5481 Occipital neuralgia: Secondary | ICD-10-CM | POA: Insufficient documentation

## 2015-04-18 DIAGNOSIS — R51 Headache: Secondary | ICD-10-CM | POA: Diagnosis present

## 2015-04-18 DIAGNOSIS — G43109 Migraine with aura, not intractable, without status migrainosus: Secondary | ICD-10-CM

## 2015-04-18 DIAGNOSIS — G43009 Migraine without aura, not intractable, without status migrainosus: Secondary | ICD-10-CM

## 2015-04-18 DIAGNOSIS — M546 Pain in thoracic spine: Secondary | ICD-10-CM | POA: Diagnosis present

## 2015-04-18 DIAGNOSIS — M542 Cervicalgia: Secondary | ICD-10-CM | POA: Diagnosis present

## 2015-04-18 MED ORDER — ORPHENADRINE CITRATE ER 100 MG PO TB12: ORAL_TABLET | ORAL | Status: DC

## 2015-04-18 MED ORDER — OXYCODONE HCL 10 MG PO TABS: ORAL_TABLET | ORAL | Status: DC

## 2015-04-18 NOTE — Progress Notes (Signed)
Safety precautions to be maintained throughout the outpatient stay will include: orient to surroundings, keep bed in low position, maintain call bell within reach at all times, provide assistance with transfer out of bed and ambulation.  

## 2015-04-18 NOTE — Progress Notes (Signed)
   Subjective:    Patient ID: Isabel Robinson, female    DOB: 1976-01-17, 39 y.o.   MRN: [id number]  HPI  The patient is a 39 year old female who returns to pain management Center for further evaluation and treatment of pain involving headaches as well as pain involving the neck entire back upper and lower extremity regions. Patient states that she recently has been diagnosed as having endometriosis. The patient has significant pain of the lumbar lower extremity region as well as pelvic pain. We will continue medications as prescribed at this time and patient will undergo further evaluation and assessment of her condition as planned. 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 Patient states that this appears to have been contributing to some of increase arthralgias and myalgias which patient is presently experiencing. We will continue medications as prescribed and patient will undergo follow-up evaluation with Dr. Harl BowieMassoud primary care physician as well as further evaluation of her endometriosis plan. We will remain available to consider modifications of treatment regimen pending assessment of patient's condition and follow-up evaluation. The patient was understanding and agreement suggested treatment plan    Review of Systems     Objective:   Physical Exam  There was tenderness to palpation over the splenius capitis and occipitalis regions. Palpation of these regions reproduce moderate discomfort. There was tenderness to palpation of the temporomandibular joint region a mild degree. No bounding pulsations of the temporal region were noted. There was tends to palpation of the cervical facet cervical paraspinal musculature region of moderate degree. Palpation over the thoracic facet thoracic paraspinal musculature region was with moderate tends to palpation as well patient appeared to be with bilaterally equal grip strength  and Tinel and Phalen's maneuver were without increased pain of significant degree. Palpation over the lumbar paraspinal musculature region was associated tensed palpation of moderate degree. Lateral bending rotation extension and palpation of the lumbar facets reproduce moderate discomfort. There was moderate tenderness to palpation over the PSIS and PSIS region as well as as the greater trochanteric region. Straight leg raise was tolerates approximately 30 without increased pain with dorsiflexion noted. There was negative clonus negative Homans. No sensory deficit or dermatomal distribution detected. There was no tenderness to palpation of the abdominal region. No costovertebral tenderness noted.          Assessment & Plan:    Degenerative disc disease lumbar spine  Lumbar facet syndrome  Fibromyalgia  Sacroiliac joint dysfunction  Bilateral occipital neuralgia  Greater trochanteric bursitis  Endometriosis     PLAN  Continue present medication Norflex and oxycodone and Flector patch  F/U PCP for evaliation of  BP and general medical  condition  F/U surgical evaluation. May consider pending follow-up evaluations  F/U neurological evaluation. May consider pending follow-up evaluations  TENS unit. Please obtain TENS unit as discussed   May consider radiofrequency rhizolysis or intraspinal procedures pending response to present treatment and F/U evaluation   Patient to call Pain Management Center should patient have concerns prior to scheduled return appointment.

## 2015-04-18 NOTE — Patient Instructions (Signed)
PLAN   Continue present medication Norflex and oxycodone and Flector patch  F/U PCP for evaliation of  BP and general medical  condition  F/U surgical evaluation. May consider pending follow-up evaluations  F/U neurological evaluation. May consider pending follow-up evaluations  TENS unit. Please obtain TENS unit as discussed   May consider radiofrequency rhizolysis or intraspinal procedures pending response to present treatment and F/U evaluation   Patient to call Pain Management Center should patient have concerns prior to scheduled return appointment.

## 2015-04-19 ENCOUNTER — Encounter: Payer: BLUE CROSS/BLUE SHIELD | Admitting: Pain Medicine

## 2015-05-17 ENCOUNTER — Encounter: Payer: Self-pay | Admitting: Pain Medicine

## 2015-05-17 ENCOUNTER — Ambulatory Visit: Payer: 59 | Attending: Pain Medicine | Admitting: Pain Medicine

## 2015-05-17 VITALS — BP 142/97 | HR 109 | Temp 98.5°F | Resp 18 | Ht 64.0 in | Wt 145.0 lb

## 2015-05-17 DIAGNOSIS — G43009 Migraine without aura, not intractable, without status migrainosus: Secondary | ICD-10-CM

## 2015-05-17 DIAGNOSIS — M7060 Trochanteric bursitis, unspecified hip: Secondary | ICD-10-CM | POA: Insufficient documentation

## 2015-05-17 DIAGNOSIS — M797 Fibromyalgia: Secondary | ICD-10-CM | POA: Diagnosis not present

## 2015-05-17 DIAGNOSIS — M5481 Occipital neuralgia: Secondary | ICD-10-CM | POA: Diagnosis not present

## 2015-05-17 DIAGNOSIS — G43109 Migraine with aura, not intractable, without status migrainosus: Secondary | ICD-10-CM

## 2015-05-17 DIAGNOSIS — M533 Sacrococcygeal disorders, not elsewhere classified: Secondary | ICD-10-CM | POA: Insufficient documentation

## 2015-05-17 DIAGNOSIS — R51 Headache: Secondary | ICD-10-CM | POA: Diagnosis present

## 2015-05-17 DIAGNOSIS — N809 Endometriosis, unspecified: Secondary | ICD-10-CM | POA: Insufficient documentation

## 2015-05-17 DIAGNOSIS — M542 Cervicalgia: Secondary | ICD-10-CM | POA: Diagnosis present

## 2015-05-17 DIAGNOSIS — M5136 Other intervertebral disc degeneration, lumbar region: Secondary | ICD-10-CM | POA: Insufficient documentation

## 2015-05-17 MED ORDER — OXYCODONE HCL 10 MG PO TABS: ORAL_TABLET | ORAL | Status: DC

## 2015-05-17 NOTE — Patient Instructions (Signed)
PLAN   Continue present medication Norflex and oxycodone and Flector patch  F/U PCP Dr Juel BurrowMasoud for evaliation of  BP and general medical  condition  F/U surgical evaluation. May consider pending follow-up evaluations  F/U neurological evaluation. May consider pending follow-up evaluations  TENS unit. Please obtain TENS unit as discussed   May consider radiofrequency rhizolysis or intraspinal procedures pending response to present treatment and F/U evaluation   Patient to call Pain Management Center should patient have concerns prior to scheduled return appointment.

## 2015-05-17 NOTE — Progress Notes (Signed)
   Subjective:    Patient ID: Isabel Robinson, female    DOB: 1975/11/14, 39 y.o.   MRN: [id number]  HPI The patient is a 39 year old female who returns to pain management Center for further evaluation and treatment of pain involving the neck with headaches as well as the lower back and lower extremity regions with component of greater trochanteric bursitis as well as sacroiliac joint dysfunction. At the present time the patient states the pain is fairly well-controlled and is without need for interventional treatment. We will continue present medications at this time and we'll remain available to consider patient for interventional treatment she is patient's symptoms increase or should there be change in patient's condition. The patient admits to some mild increase of her pain due to the cold damp weather. At the present time we will observe response to the present treatment and will proceed with treatment modifications as necessary. The patient was with understanding and agreement with suggested treatment plan      Review of Systems     Objective:   Physical Exam  There was tenderness to palpation of the paraspinal musculature region of the cervical region and cervical facet region. Palpation of the splenius capitis and occipitalis musculature regions reproduced pain of moderate degree.. The patient appeared to be with bilaterally equal grip strength. Tinel and Phalen's maneuver were without increased pain of significant degree. There was tends to palpation over the thoracic facet thoracic paraspinal musculature region a mild degree. No crepitus of the thoracic region was noted. Palpation over the region of the lumbar paraspinal muscles lumbar facet region was with mild to moderate discomfort. Lateral bending rotation extension and palpation the lumbar facets reproduce mild to moderate discomfort. There was  moderate tenderness to palpation over the greater trochanteric region and iliotibial band  region. Gait leg raising was tolerates approximately 30 without an increase of pain with dorsiflexion noted. No definite sensory deficit of dermatomal distribution was detected. There was negative clonus negative Homans. Abdomen was nontender with no costovertebral tenderness noted        Assessment & there was unremarkable Spurling's maneuver. Plan:    Degenerative disc disease lumbar spine  Lumbar facet syndrome  Fibromyalgia  Sacroiliac joint dysfunction  Bilateral occipital neuralgia  Greater trochanteric bursitis  Endometriosis      PLAN   Continue present medication Norflex and oxycodone and Flector patch  F/U PCP Dr Juel BurrowMasoud for evaliation of  BP and general medical  condition  F/U surgical evaluation. May consider pending follow-up evaluations  F/U neurological evaluation. May consider pending follow-up evaluations  TENS unit. Please obtain TENS unit as discussed   May consider radiofrequency rhizolysis or intraspinal procedures pending response to present treatment and F/U evaluation   Patient to call Pain Management Center should patient have concerns prior to scheduled return appointment.

## 2015-05-17 NOTE — Progress Notes (Signed)
Safety precautions to be maintained throughout the outpatient stay will include: orient to surroundings, keep bed in low position, maintain call bell within reach at all times, provide assistance with transfer out of bed and ambulation.  

## 2015-05-20 DIAGNOSIS — A419 Sepsis, unspecified organism: Secondary | ICD-10-CM

## 2015-05-20 HISTORY — DX: Sepsis, unspecified organism: A41.9

## 2015-06-14 ENCOUNTER — Ambulatory Visit: Payer: 59 | Attending: Pain Medicine | Admitting: Pain Medicine

## 2015-06-14 ENCOUNTER — Encounter: Payer: Self-pay | Admitting: Pain Medicine

## 2015-06-14 VITALS — BP 142/87 | HR 101 | Temp 98.4°F | Resp 16 | Ht 64.0 in | Wt 145.0 lb

## 2015-06-14 DIAGNOSIS — M5481 Occipital neuralgia: Secondary | ICD-10-CM | POA: Insufficient documentation

## 2015-06-14 DIAGNOSIS — N809 Endometriosis, unspecified: Secondary | ICD-10-CM | POA: Insufficient documentation

## 2015-06-14 DIAGNOSIS — M797 Fibromyalgia: Secondary | ICD-10-CM | POA: Insufficient documentation

## 2015-06-14 DIAGNOSIS — M7060 Trochanteric bursitis, unspecified hip: Secondary | ICD-10-CM | POA: Diagnosis not present

## 2015-06-14 DIAGNOSIS — M5136 Other intervertebral disc degeneration, lumbar region: Secondary | ICD-10-CM | POA: Diagnosis not present

## 2015-06-14 DIAGNOSIS — M546 Pain in thoracic spine: Secondary | ICD-10-CM | POA: Diagnosis present

## 2015-06-14 DIAGNOSIS — M533 Sacrococcygeal disorders, not elsewhere classified: Secondary | ICD-10-CM

## 2015-06-14 DIAGNOSIS — G43109 Migraine with aura, not intractable, without status migrainosus: Secondary | ICD-10-CM

## 2015-06-14 DIAGNOSIS — G43009 Migraine without aura, not intractable, without status migrainosus: Secondary | ICD-10-CM

## 2015-06-14 DIAGNOSIS — R51 Headache: Secondary | ICD-10-CM | POA: Diagnosis present

## 2015-06-14 DIAGNOSIS — M542 Cervicalgia: Secondary | ICD-10-CM | POA: Diagnosis present

## 2015-06-14 MED ORDER — OXYCODONE HCL 10 MG PO TABS: ORAL_TABLET | ORAL | Status: DC

## 2015-06-14 MED ORDER — ORPHENADRINE CITRATE ER 100 MG PO TB12: ORAL_TABLET | ORAL | Status: DC

## 2015-06-14 NOTE — Progress Notes (Signed)
Safety precautions to be maintained throughout the outpatient stay will include: orient to surroundings, keep bed in low position, maintain call bell within reach at all times, provide assistance with transfer out of bed and ambulation.  

## 2015-06-14 NOTE — Progress Notes (Signed)
   Subjective:    Patient ID: Isabel Robinson, female    DOB: 11-Mar-1976, 39 y.o.   MRN: [id number]  HPI The patient is a 40 year old female who returns to pain management for further evaluation and treatment of pain involving the neck entire back upper and lower extremity region as well as headaches. The patient has obtained TENS unit and continuing his medications consisting of Norflex oxycodone and Flector patch. The patient states that she is also getting more sleep. The patient was with decreased pain involving multiple regions and has had improved ability to perform activities of daily living as well as obtaining restful sleep. The patient denied any trauma change in events of daily living call significant change in symptomatology. The patient stated that she was without trauma change in events of daily living the call significant change in symptoms and that the TENS unit had been quite beneficial in terms of decreasing her pain. We will continue present treatment regimen and we'll consider patient for modification of treatment pending follow-up evaluation. The patient was with understanding and agreed with suggested treatment plan.   Review of Systems     Objective:   Physical Exam  There was tenderness of the splenius capitis and occipitalis musculature regions of mild degree with mild tenderness over the cervical facet cervical paraspinal musculature region as well as the thoracic facet thoracic paraspinal musculature region. He appeared to be with bilaterally equal grip strength and Tinel and Phalen's maneuver were without increased pain of significant degree. Palpation over the region of the trapezius levator scapula and rhomboid musculature region reproduced mild to moderate discomfort. Palpation over the lumbar paraspinal must reason lumbar facet region was attends to palpation of mild degree with lateral bending rotation extension and palpation of the lumbar facets reproducing mild  discomfort. There was tenderness over the PSIS and PII S region of mild degree with mild tenderness of the greater trochanteric region and iliotibial band region. Straight leg raise was tolerates approximately 30 without increased pain with dorsiflexion noted. There was negative clonus negative Homans. DTRs were difficult to elicit appeared to be trace at the knees. No sensory deficit or dermatomal distribution detected. The knees were without excessive tends to palpation and without significant crepitus. There was negative clonus negative Homans. Abdomen was nontender with no costovertebral tenderness noted      Assessment & Plan:     Degenerative disc disease lumbar spine  Lumbar facet syndrome  Fibromyalgia  Sacroiliac joint dysfunction  Bilateral occipital neuralgia  Greater trochanteric bursitis  Endometriosis       PLAN   Continue present medication Norflex and oxycodone and Flector patch  F/U PCP Dr Juel Burrow for evaliation of  BP and general medical  condition  F/U surgical evaluation. May consider pending follow-up evaluations  F/U neurological evaluation. May consider pending follow-up evaluations  TENS unit as discussed   May consider radiofrequency rhizolysis or intraspinal procedures pending response to present treatment and F/U evaluation   Patient to call Pain Management Center should patient have concerns prior to scheduled return appointment.

## 2015-06-14 NOTE — Patient Instructions (Signed)
PLAN   Continue present medication Norflex and oxycodone and Flector patch  F/U PCP Dr Juel Burrow for evaliation of  BP and general medical  condition  F/U surgical evaluation. May consider pending follow-up evaluations  F/U neurological evaluation. May consider pending follow-up evaluations  TENS unit as discussed   May consider radiofrequency rhizolysis or intraspinal procedures pending response to present treatment and F/U evaluation   Patient to call Pain Management Center should patient have concerns prior to scheduled return appointment.

## 2015-06-25 ENCOUNTER — Other Ambulatory Visit: Payer: Self-pay | Admitting: Pain Medicine

## 2015-07-12 ENCOUNTER — Encounter: Payer: Self-pay | Admitting: Pain Medicine

## 2015-07-12 ENCOUNTER — Ambulatory Visit: Payer: 59 | Attending: Pain Medicine | Admitting: Pain Medicine

## 2015-07-12 VITALS — BP 112/70 | HR 96 | Temp 98.1°F | Resp 16 | Ht 64.0 in | Wt 145.0 lb

## 2015-07-12 DIAGNOSIS — N809 Endometriosis, unspecified: Secondary | ICD-10-CM | POA: Insufficient documentation

## 2015-07-12 DIAGNOSIS — M5481 Occipital neuralgia: Secondary | ICD-10-CM | POA: Insufficient documentation

## 2015-07-12 DIAGNOSIS — M546 Pain in thoracic spine: Secondary | ICD-10-CM | POA: Diagnosis present

## 2015-07-12 DIAGNOSIS — M5136 Other intervertebral disc degeneration, lumbar region: Secondary | ICD-10-CM | POA: Diagnosis not present

## 2015-07-12 DIAGNOSIS — M797 Fibromyalgia: Secondary | ICD-10-CM | POA: Diagnosis not present

## 2015-07-12 DIAGNOSIS — M7060 Trochanteric bursitis, unspecified hip: Secondary | ICD-10-CM | POA: Diagnosis not present

## 2015-07-12 DIAGNOSIS — M533 Sacrococcygeal disorders, not elsewhere classified: Secondary | ICD-10-CM | POA: Insufficient documentation

## 2015-07-12 DIAGNOSIS — G43109 Migraine with aura, not intractable, without status migrainosus: Secondary | ICD-10-CM

## 2015-07-12 DIAGNOSIS — G43009 Migraine without aura, not intractable, without status migrainosus: Secondary | ICD-10-CM

## 2015-07-12 DIAGNOSIS — M542 Cervicalgia: Secondary | ICD-10-CM | POA: Diagnosis present

## 2015-07-12 MED ORDER — DICLOFENAC EPOLAMINE 1.3 % TD PTCH: 1.0000 | MEDICATED_PATCH | Freq: Two times a day (BID) | TRANSDERMAL | Status: DC

## 2015-07-12 MED ORDER — OXYCODONE HCL 10 MG PO TABS: ORAL_TABLET | ORAL | Status: DC

## 2015-07-12 MED ORDER — ORPHENADRINE CITRATE ER 100 MG PO TB12: ORAL_TABLET | ORAL | Status: DC

## 2015-07-12 NOTE — Patient Instructions (Addendum)
PLAN   Continue present medication Norflex and oxycodone and Flector patch  F/U PCP Dr Juel Burrow for evaliation of  BP and general medical  condition  F/U surgical evaluation. May consider pending follow-up evaluations  F/U neurological evaluation. May consider pending follow-up evaluations  TENS unit as discussed   May consider radiofrequency rhizolysis or intraspinal procedures pending response to present treatment and F/U evaluation area we will avoid such procedures at this time  Patient to call Pain Management Center should patient have concerns prior to scheduled return appointment.

## 2015-07-12 NOTE — Progress Notes (Signed)
   Subjective:    Patient ID: Isabel Robinson, female    DOB: 16-Jul-1975, 40 y.o.   MRN: [id number]  HPI The patient is a 40 year old female who returns to pain management for further evaluation and treatment of pain involving the region of the neck entire back upper and lower extremity regions. The patient has occasional muscle spasms occurring in the entire back interfere with patient ability to perform activities of daily living as well as obtaining restful sleep. The patient denies any recent trauma change in events of daily living call significant change in symptomatology. The patient continues to work and medications have allowed patient to perform activities on the job without severely and disabling pain interfering with activities on the job. We will continue medications as prescribed and we'll consider modification of treatment regimen including interventional treatment as discussed with patient on today's visit. The patient was in agreement with suggested treatment plan. We will continue Norflex and oxycodone as well as use of Flector patch as prescribed at this time he at all agreed to suggested treatment plan   Review of Systems     Objective:   Physical Exam  There was tenderness of the paraspinal misreading the cervical region cervical facet region palpation of this region reproduced pain of mild-to-moderate degree with mild to moderate tenderness over the splenius capitis and occipitalis regions. There appeared to be unremarkable Spurling's maneuver and there was tends to palpation of the acromioclavicular and glenohumeral joint regions of minimal degree. The patient appeared to be with bilaterally equal grip strength and Tinel and Phalen's maneuver were without increase of pain of significant degree. Palpation over the thoracic facet thoracic paraspinal musculature region was a tennis to palpation of moderate degree with no crepitus of the thoracic region noted. Palpation over the lumbar  paraspinal must reason lumbar facet region was attends to palpation of moderate degree with lateral bending and rotation reproducing moderate discomfort. There was moderate muscle spasms of the lower thoracic region and lumbar paraspinal musculature region. Palpation of these regions reproduces moderate discomfort. Mild to moderate tenderness of the PSIS and PII S regions. There was mild tenderness of the gluteal and piriformis musculature regions. Palpation of the greater trochanteric region and iliotibial band region reproduced moderate to moderately severe discomfort. There was straight leg raising tolerate approximately 30 without increased pain with dorsiflexion noted. DTRs appeared to be trace at the knees. No sensory deficit or dermatomal dystrophy detected. There was negative clonus negative Homans. Abdomen was nontender with no costovertebral tenderness noted.    Assessment & Plan:   Degenerative disc disease lumbar spine  Lumbar facet syndrome  Fibromyalgia  Sacroiliac joint dysfunction  Bilateral occipital neuralgia  Greater trochanteric bursitis  Endometriosis     PLAN   Continue present medication Norflex and oxycodone and Flector patch  F/U PCP Dr Juel Burrow for evaliation of  BP and general medical  condition  F/U surgical evaluation. May consider pending follow-up evaluations  F/U neurological evaluation. May consider pending follow-up evaluations  TENS unit as discussed   May consider radiofrequency rhizolysis or intraspinal procedures pending response to present treatment and F/U evaluation area we will avoid such procedures at this time  Patient to call Pain Management Center should patient have concerns prior to scheduled return appointment.

## 2015-07-12 NOTE — Progress Notes (Signed)
Safety precautions to be maintained throughout the outpatient stay will include: orient to surroundings, keep bed in low position, maintain call bell within reach at all times, provide assistance with transfer out of bed and ambulation.  

## 2015-08-09 ENCOUNTER — Encounter: Payer: Self-pay | Admitting: Pain Medicine

## 2015-08-09 ENCOUNTER — Ambulatory Visit: Payer: 59 | Attending: Pain Medicine | Admitting: Pain Medicine

## 2015-08-09 VITALS — BP 125/92 | HR 111 | Temp 98.3°F | Resp 16 | Ht 64.0 in | Wt 145.0 lb

## 2015-08-09 DIAGNOSIS — M533 Sacrococcygeal disorders, not elsewhere classified: Secondary | ICD-10-CM

## 2015-08-09 DIAGNOSIS — M5136 Other intervertebral disc degeneration, lumbar region: Secondary | ICD-10-CM | POA: Insufficient documentation

## 2015-08-09 DIAGNOSIS — M7060 Trochanteric bursitis, unspecified hip: Secondary | ICD-10-CM | POA: Diagnosis not present

## 2015-08-09 DIAGNOSIS — G43109 Migraine with aura, not intractable, without status migrainosus: Secondary | ICD-10-CM

## 2015-08-09 DIAGNOSIS — R51 Headache: Secondary | ICD-10-CM | POA: Insufficient documentation

## 2015-08-09 DIAGNOSIS — M546 Pain in thoracic spine: Secondary | ICD-10-CM | POA: Diagnosis present

## 2015-08-09 DIAGNOSIS — N809 Endometriosis, unspecified: Secondary | ICD-10-CM | POA: Diagnosis not present

## 2015-08-09 DIAGNOSIS — M5481 Occipital neuralgia: Secondary | ICD-10-CM | POA: Diagnosis not present

## 2015-08-09 DIAGNOSIS — G43009 Migraine without aura, not intractable, without status migrainosus: Secondary | ICD-10-CM

## 2015-08-09 DIAGNOSIS — M797 Fibromyalgia: Secondary | ICD-10-CM | POA: Diagnosis not present

## 2015-08-09 DIAGNOSIS — M545 Low back pain: Secondary | ICD-10-CM | POA: Diagnosis present

## 2015-08-09 MED ORDER — OXYCODONE HCL 10 MG PO TABS: ORAL_TABLET | ORAL | Status: DC

## 2015-08-09 MED ORDER — DICLOFENAC EPOLAMINE 1.3 % TD PTCH: 1.0000 | MEDICATED_PATCH | Freq: Two times a day (BID) | TRANSDERMAL | Status: DC

## 2015-08-09 NOTE — Progress Notes (Signed)
Safety precautions to be maintained throughout the outpatient stay will include: orient to surroundings, keep bed in low position, maintain call bell within reach at all times, provide assistance with transfer out of bed and ambulation.  

## 2015-08-09 NOTE — Progress Notes (Signed)
   Subjective:    Patient ID: Isabel FudgeStacey S Bixler, female    DOB: [birth year], 40 y.o.   MRN: [id number]  HPI  The patient is a 40 year old female who returns to pain management for further evaluation and treatment of pain involving headaches as well as pain in the upper mid lower back and lower extremity region. The patient admits to some exacerbation of her pain at this time which she attributes to the cold weather. The patient states that she has been sitting watching her son play baseball late at night. The patient states that sitting on the bleachers in the cold weather appears to have aggravated some of her symptoms. We will continue medications as prescribed at this time with medications consisting of Norflex oxycodone and the use of Flector patch. The patient denies any other change in events of daily living the call significant change in symptomatology. We've been taking to remain available to consider patient for interventional treatment should they be significant exacerbation of pain and patient is to call pain management should they be change in condition prior to scheduled return appointment. All agreed to suggested treatment plan.     Review of Systems     Objective:   Physical Exam  There was tenderness to palpation of the splenius capitis and occipitalis musculature regions palpation which reproduces moderate discomfort. There was moderate tenderness of the cervical facet cervical paraspinal musculature region. Palpation of the acromioclavicular and glenohumeral joint regions were with mild to moderate discomfort. The patient was able to perform drop test without significant difficulty. There was unremarkable Spurling's maneuver. Palpation over the thoracic facet thoracic paraspinal must reason was evidence of muscle spasms of moderate degree in the mid and lower thoracic region with no crepitus of the thoracic region noted. Palpation over the lumbar facet lumbar paraspinal must reason  reproduced moderate discomfort. Lateral bending rotation extension and palpation of the lumbar facets reproduce moderate discomfort. There was moderate tenderness of the PSIS and PII S region as well as the greater trochanteric region and iliotibial band region there was increase of pain with pressure applied to the ileum with patient in lateral decubitus position. Straight leg raising was tolerates approximately 30 without increased pain with dorsiflexion noted. The knees were negative anterior and posterior drawer signs without ballottement of the patella. DTRs appeared to be trace at the knees. No sensory deficit or dermatomal dystrophy detected. There was negative clonus negative Homans. Abdomen nontender without excessive tends to palpation. There is no costovertebral tenderness noted.          Assessment & Plan:     Degenerative disc disease lumbar spine  Lumbar facet syndrome  Fibromyalgia  Sacroiliac joint dysfunction  Bilateral occipital neuralgia  Greater trochanteric bursitis  Endometriosis       PLAN   Continue present medication Norflex and oxycodone and Flector patch  F/U PCP Dr Juel BurrowMasoud for evaliation of  BP and general medical  condition  F/U surgical evaluation. May consider pending follow-up evaluations  F/U neurological evaluation. May consider pending follow-up evaluations  TENS unit as discussed   May consider radiofrequency rhizolysis or intraspinal procedures pending response to present treatment and F/U evaluation area we will avoid such procedures at this time. We will avoid such treatment at this time  Patient to call Pain Management Center should patient have concerns prior to scheduled return appointment.

## 2015-08-09 NOTE — Patient Instructions (Signed)
PLAN   Continue present medication Norflex and oxycodone and Flector patch  F/U PCP Dr Juel BurrowMasoud for evaliation of  BP and general medical  condition  F/U surgical evaluation. May consider pending follow-up evaluations  F/U neurological evaluation. May consider pending follow-up evaluations  TENS unit as discussed   May consider radiofrequency rhizolysis or intraspinal procedures pending response to present treatment and F/U evaluation area we will avoid such procedures at this time. We will avoid such treatment at this time  Patient to call Pain Management Center should patient have concerns prior to scheduled return appointment.

## 2015-08-30 ENCOUNTER — Telehealth: Payer: Self-pay | Admitting: Pain Medicine

## 2015-08-30 NOTE — Telephone Encounter (Signed)
Patient is starting a new job on Monday 09-03-15, she has a med refill appt on 09-06-15 and would like to see if Dr. Metta Clinesrisp could possibly write her scripts this one time and let her make appt for May? Please call patient and let her know

## 2015-08-30 NOTE — Telephone Encounter (Signed)
Spoke with patient, will keep her appt for next week

## 2015-08-30 NOTE — Telephone Encounter (Signed)
Secretaries and Nurses Please inform patient that we can see her 7:30 AM on any day as late as 2:30 PM any day Patient will need to keep appointment in April or patient may try to decrease daily dose of medication so that it will last until May

## 2015-09-06 ENCOUNTER — Encounter: Payer: Self-pay | Admitting: Pain Medicine

## 2015-09-06 ENCOUNTER — Ambulatory Visit: Payer: 59 | Attending: Pain Medicine | Admitting: Pain Medicine

## 2015-09-06 VITALS — BP 124/63 | HR 126 | Temp 98.2°F | Resp 18 | Ht 64.0 in | Wt 145.0 lb

## 2015-09-06 DIAGNOSIS — N390 Urinary tract infection, site not specified: Secondary | ICD-10-CM | POA: Diagnosis not present

## 2015-09-06 DIAGNOSIS — M5136 Other intervertebral disc degeneration, lumbar region: Secondary | ICD-10-CM | POA: Diagnosis not present

## 2015-09-06 DIAGNOSIS — M797 Fibromyalgia: Secondary | ICD-10-CM | POA: Insufficient documentation

## 2015-09-06 DIAGNOSIS — M5481 Occipital neuralgia: Secondary | ICD-10-CM | POA: Insufficient documentation

## 2015-09-06 DIAGNOSIS — M79606 Pain in leg, unspecified: Secondary | ICD-10-CM | POA: Diagnosis present

## 2015-09-06 DIAGNOSIS — G43009 Migraine without aura, not intractable, without status migrainosus: Secondary | ICD-10-CM

## 2015-09-06 DIAGNOSIS — M7060 Trochanteric bursitis, unspecified hip: Secondary | ICD-10-CM | POA: Insufficient documentation

## 2015-09-06 DIAGNOSIS — M533 Sacrococcygeal disorders, not elsewhere classified: Secondary | ICD-10-CM | POA: Diagnosis not present

## 2015-09-06 DIAGNOSIS — N809 Endometriosis, unspecified: Secondary | ICD-10-CM | POA: Insufficient documentation

## 2015-09-06 DIAGNOSIS — G43109 Migraine with aura, not intractable, without status migrainosus: Secondary | ICD-10-CM

## 2015-09-06 DIAGNOSIS — M545 Low back pain: Secondary | ICD-10-CM | POA: Diagnosis present

## 2015-09-06 MED ORDER — OXYCODONE HCL 10 MG PO TABS: ORAL_TABLET | ORAL | Status: DC

## 2015-09-06 NOTE — Progress Notes (Signed)
Taking Vicodin for UTI

## 2015-09-06 NOTE — Progress Notes (Signed)
   Subjective:    Patient ID: Isabel Robinson, female    DOB: Dec 24, 1975, 40 y.o.   MRN: [id number]  HPI  The patient is a 40 year old female who returns to pain management for further evaluation and treatment of pain involving the lower back and lower extremity region predominantly patient states that she has urinary tract infection and has had some increased pain of the lower back region. The patient will follow-up with Dr. Harl BowieMassoud in this regard. The patient also has been attending her son's baseball games and has been sitting out in the cold damp weather in the evening hours which has been contributing to some of patient's arthralgias and myalgias. We will continue medications consisting of Norflex and oxycodone at this time and will consider patient for additional modifications of treatment pending follow-up evaluation. The patient denies any trauma change in events of daily living the call significant change in symptomatology.     Review of Systems     Objective:   Physical Exam  There was tenderness of the splenius capitis and occipitalis musculature region a mild degree with mild tenderness over the cervical facet cervical paraspinal musculature region. Palpation of the acromioclavicular and glenohumeral joint regions reproduce mild discomfort as well and patient appeared to be with bilaterally equal grip strength with Tinel and Phalen's maneuver reproducing minimal discomfort. Palpation over the thoracic facet thoracic paraspinal musculature region was attends to palpation of the lower thoracic region a moderate degree with no crepitus of the thoracic region noted. Palpation of the lower thoracic region was with moderate tenderness to palpation of moderate muscle spasms. There appeared to be unremarkable Spurling's maneuver. Palpation of the thoracic region thoracic facet region was with no crepitus of the thoracic region. Palpation over the lumbar paraspinal musculature region lumbar facet  region was attends to palpation with lateral bending rotation extension and palpation of the lumbar facets reproducing moderate discomfort. There was moderate tenderness over the PSIS PII S region and gluteal and piriformis musculature regions with straight leg raising tolerates approximately 20 without increased pain with dorsiflexion noted. DTRs were difficult to elicit there was negative clonus negative Homans no definite sensory deficit or dermatomal distribution detected. Abdomen nontender with no definite costovertebral angle tenderness noted    Assessment & Plan:    Degenerative disc disease lumbar spine  Lumbar facet syndrome  Fibromyalgia  Sacroiliac joint dysfunction  Bilateral occipital neuralgia  Greater trochanteric bursitis  Endometriosis   Urinary tract infection     PLAN   Continue present medication Norflex and oxycodone and Flector patch  F/U PCP Dr Juel BurrowMasoud for evaliation of  BP UTI  and general medical  condition  F/U surgical evaluation. May consider pending follow-up evaluations  F/U neurological evaluation. May consider pending follow-up evaluations  Consider evaluation by urologist as discussed if pain continued Please discuss with Dr. Juel BurrowMasoud  TENS unit as discussed   May consider radiofrequency rhizolysis or intraspinal procedures pending response to present treatment and F/U evaluation area we will avoid such procedures at this time. We will avoid such treatment at this time  Patient to call [facility] should patient have concerns prior to scheduled return appointment.

## 2015-09-06 NOTE — Progress Notes (Signed)
Safety precautions to be maintained throughout the outpatient stay will include: orient to surroundings, keep bed in low position, maintain call bell within reach at all times, provide assistance with transfer out of bed and ambulation.  

## 2015-09-06 NOTE — Patient Instructions (Addendum)
PLAN   Continue present medication Norflex and oxycodone and Flector patch  F/U PCP Dr Juel BurrowMasoud for evaliation of  BP UTI  and general medical  condition  F/U surgical evaluation. May consider pending follow-up evaluations  F/U neurological evaluation. May consider pending follow-up evaluations  Consider evaluation by urologist as discussed if pain continued Please discuss with Dr. Juel BurrowMasoud  TENS unit as discussed   May consider radiofrequency rhizolysis or intraspinal procedures pending response to present treatment and F/U evaluation area we will avoid such procedures at this time. We will avoid such treatment at this time  Patient to call [facility] should patient have concerns prior to scheduled return appointment.

## 2015-09-13 LAB — TOXASSURE SELECT 13 (MW), URINE: PDF: 0

## 2015-09-13 NOTE — Progress Notes (Signed)
Quick Note:  Reviewed. ______ 

## 2015-10-04 ENCOUNTER — Encounter: Payer: Self-pay | Admitting: Pain Medicine

## 2015-10-04 ENCOUNTER — Ambulatory Visit: Payer: 59 | Attending: Pain Medicine | Admitting: Pain Medicine

## 2015-10-04 VITALS — BP 117/74 | HR 103 | Temp 98.2°F | Resp 16 | Wt 145.0 lb

## 2015-10-04 DIAGNOSIS — G43009 Migraine without aura, not intractable, without status migrainosus: Secondary | ICD-10-CM

## 2015-10-04 DIAGNOSIS — M5481 Occipital neuralgia: Secondary | ICD-10-CM | POA: Insufficient documentation

## 2015-10-04 DIAGNOSIS — M542 Cervicalgia: Secondary | ICD-10-CM | POA: Diagnosis present

## 2015-10-04 DIAGNOSIS — N809 Endometriosis, unspecified: Secondary | ICD-10-CM | POA: Insufficient documentation

## 2015-10-04 DIAGNOSIS — G43109 Migraine with aura, not intractable, without status migrainosus: Secondary | ICD-10-CM

## 2015-10-04 DIAGNOSIS — R51 Headache: Secondary | ICD-10-CM | POA: Diagnosis present

## 2015-10-04 DIAGNOSIS — M797 Fibromyalgia: Secondary | ICD-10-CM | POA: Insufficient documentation

## 2015-10-04 DIAGNOSIS — M7060 Trochanteric bursitis, unspecified hip: Secondary | ICD-10-CM | POA: Diagnosis not present

## 2015-10-04 DIAGNOSIS — M5136 Other intervertebral disc degeneration, lumbar region: Secondary | ICD-10-CM | POA: Diagnosis not present

## 2015-10-04 DIAGNOSIS — M533 Sacrococcygeal disorders, not elsewhere classified: Secondary | ICD-10-CM | POA: Diagnosis not present

## 2015-10-04 MED ORDER — DICLOFENAC EPOLAMINE 1.3 % TD PTCH: MEDICATED_PATCH | TRANSDERMAL | Status: DC

## 2015-10-04 MED ORDER — OXYCODONE HCL 10 MG PO TABS: ORAL_TABLET | ORAL | Status: DC

## 2015-10-04 MED ORDER — ORPHENADRINE CITRATE ER 100 MG PO TB12: ORAL_TABLET | ORAL | Status: DC

## 2015-10-04 NOTE — Progress Notes (Signed)
   Subjective:    Patient ID: Isabel Robinson, female    DOB: Oct 15, 1975, 40 y.o.   MRN: [id number]  HPI  The patient is a 40 year old female who returns to pain management for further evaluation and treatment of pain involving headaches as well as pain involving the neck entire back upper and lower extremity region. The patient has recover from the urinary tract infection and states that her pain is fairly well-controlled at this time. The patient continues medication consisting of Norflex oxycodone and Flector patch. The patient admits to some pain of the lower back region as well as pain along the greater trochanteric region iliotibial band region. The patient denies any trauma change in events of daily living the call significant change in symptoms other. We discussed patient's condition and will continue presently prescribed medications all agreed to suggested treatment plan        Review of Systems     Objective:   Physical Exam  Tere was tenderness to palpation of paraspinal misreading the cervical region cervical facet region a mhild degree with mild tenderness of the splenius capitis and occipitalis region. The patient appeared to be with bilaterally equal grip strength and Tinel and Phalen's maneuver were without increased pain of significant degree. Palpation of the acromioclavicular and glenohumeral joint regions reproduces minimal discomfort and patient appeared to be with unremarkable Spurling's maneuver. Palpation over the thoracic region thoracic facet region was attends to palpation with mild muscle spasms noted in the lower thoracic paraspinal musculature region with no crepitus of the thoracic region noted. Palpation over the lumbar paraspinal musculature region lumbar facet region was attends to palpation of mild to moderate degree with lateral bending rotation extension and palpation of the lumbar facets reproducing mild to moderate discomfort. Straight leg raise was tolerated  to 30 without increased pain with dorsiflexion noted. There was tenderness of the PSIS PII S region with moderate tenderness of the greater trochanteric region iliotibial band region. DTRs appeared to be trace at the knees. There was no excessive tends to palpation of the knees noted. . No sensory deficit or dermatomal distribution detected. EHL strength appeared to be equal and there was negative clonus negative Homans. Abdomen without excessive tends to palpation and no costovertebral tenderness noted      Assessment & Plan:    Degenerative disc disease lumbar spine  Lumbar facet syndrome  Fibromyalgia  Sacroiliac joint dysfunction  Bilateral occipital neuralgia  Greater trochanteric bursitis  Endometriosis      PLAN   Continue present medication Norflex and oxycodone and Flector patch  F/U PCP Dr Juel BurrowMasoud for evaliation of  BP UTI  and general medical  condition  F/U surgical evaluation. May consider pending follow-up evaluations  F/U neurological evaluation. May consider PNCV/EMG studies and other studies pending follow-up evaluations  Consider evaluation by urologist as discussed if pain continued Please discuss with Dr. Juel BurrowMasoud  TENS unit as discussed   May consider radiofrequency rhizolysis or intraspinal procedures pending response to present treatment and F/U evaluation area we will avoid such procedures at this time. We will avoid such treatment at this time  Patient to call [facility] should patient have concerns prior to scheduled return appointment.

## 2015-10-04 NOTE — Patient Instructions (Signed)
PLAN   Continue present medication Norflex and oxycodone and Flector patch  F/U PCP Dr Juel BurrowMasoud for evaliation of  BP UTI  and general medical  condition  F/U surgical evaluation. May consider pending follow-up evaluations  F/U neurological evaluation. May consider PNCV/EMG studies and other studies pending follow-up evaluations  Consider evaluation by urologist as discussed if pain continued Please discuss with Dr. Juel BurrowMasoud  TENS unit as discussed   May consider radiofrequency rhizolysis or intraspinal procedures pending response to present treatment and F/U evaluation area we will avoid such procedures at this time. We will avoid such treatment at this time  Patient to call [facility] should patient have concerns prior to scheduled return appointment.

## 2015-10-04 NOTE — Progress Notes (Signed)
Safety precautions to be maintained throughout the outpatient stay will include: orient to surroundings, keep bed in low position, maintain call bell within reach at all times, provide assistance with transfer out of bed and ambulation.  

## 2015-10-29 ENCOUNTER — Encounter: Payer: Self-pay | Admitting: Pain Medicine

## 2015-10-29 ENCOUNTER — Ambulatory Visit: Payer: 59 | Attending: Pain Medicine | Admitting: Pain Medicine

## 2015-10-29 VITALS — BP 123/85 | HR 90 | Temp 98.0°F | Resp 16 | Ht 64.0 in | Wt 150.0 lb

## 2015-10-29 DIAGNOSIS — M797 Fibromyalgia: Secondary | ICD-10-CM | POA: Insufficient documentation

## 2015-10-29 DIAGNOSIS — M5136 Other intervertebral disc degeneration, lumbar region: Secondary | ICD-10-CM | POA: Insufficient documentation

## 2015-10-29 DIAGNOSIS — N809 Endometriosis, unspecified: Secondary | ICD-10-CM | POA: Diagnosis not present

## 2015-10-29 DIAGNOSIS — R51 Headache: Secondary | ICD-10-CM | POA: Insufficient documentation

## 2015-10-29 DIAGNOSIS — G43009 Migraine without aura, not intractable, without status migrainosus: Secondary | ICD-10-CM

## 2015-10-29 DIAGNOSIS — M7060 Trochanteric bursitis, unspecified hip: Secondary | ICD-10-CM | POA: Insufficient documentation

## 2015-10-29 DIAGNOSIS — Z87440 Personal history of urinary (tract) infections: Secondary | ICD-10-CM | POA: Insufficient documentation

## 2015-10-29 DIAGNOSIS — M542 Cervicalgia: Secondary | ICD-10-CM | POA: Diagnosis present

## 2015-10-29 DIAGNOSIS — G43109 Migraine with aura, not intractable, without status migrainosus: Secondary | ICD-10-CM

## 2015-10-29 DIAGNOSIS — M5481 Occipital neuralgia: Secondary | ICD-10-CM | POA: Diagnosis not present

## 2015-10-29 DIAGNOSIS — M533 Sacrococcygeal disorders, not elsewhere classified: Secondary | ICD-10-CM

## 2015-10-29 MED ORDER — DICLOFENAC EPOLAMINE 1.3 % TD PTCH: MEDICATED_PATCH | TRANSDERMAL | Status: DC

## 2015-10-29 MED ORDER — OXYCODONE HCL 10 MG PO TABS: ORAL_TABLET | ORAL | Status: DC

## 2015-10-29 NOTE — Progress Notes (Signed)
   Subjective:    Patient ID: Gwenlyn FudgeStacey S Zangara, female    DOB: [birth year], 40 y.o.   MRN: [id number]  HPI  The patient is a 40 year old female who returns to pain management for further evaluation and treatment of pain involving the neck associated with headaches as well as pain involving the upper mid lower back and lower extremity region. The patient states that she has recovered from urinary tract infection and no longer is with symptoms of urinary tract infection. 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 The patient denies any other change in events of daily living or any significant trauma. We discussed patient's condition and will continue medications as prescribed. We remain available to consider interventional treatment as well as additional modifications of treatment regimen pending response to treatment and follow-up evaluation. All agreed to suggested treatment plan.  Review of Systems     Objective:   Physical Exam  There was tends to palpation of the paraspinal muscular regions of the cervical region and cervical facet region Palpation which be produced pain of mild degree with mild tenderness of the splenius capitis and occipitalis regions. Palpation of the acromioclavicular and glenohumeral joint regions reproduce mild discomfort. The patient was able to perform drop test without significant difficulty. Palpation over the thoracic region thoracic facet region was attends to palpation of moderate degree with moderate muscle spasms involving the mid and lower thoracic regions. No crepitus of the thoracic region was noted. Palpation over the lumbar paraspinal must reason lumbar facet region was associated with moderate discomfort with lateral bending rotation extension and palpation over the lumbar facets reproducing mild to moderate discomfort. There was  moderate tenderness of the greater trochanteric region iliotibial band region. There was increased pain with pressure applied to the ileum with patient in lateral decubitus position. Palpation over the PSIS and PII S region reproduces moderate discomfort. Straight leg raise was tolerates approximately 30 without increase of pain with dorsiflexion noted. There was mild tightness to palpation of the knees with negative anterior and posterior drawer signs There appeared to be negative clonus negative Homans. No sensory deficit or dermatomal speech detected. EHL strength appeared to be equal. Abdomen was nontender and no costovertebral tenderness was noted      Assessment & Plan:    Degenerative disc disease lumbar spine  Lumbar facet syndrome  Fibromyalgia  Sacroiliac joint dysfunction  Bilateral occipital neuralgia  Greater trochanteric bursitis  Endometriosis       PLAN   Continue present medications Norflex oxycodone and Flector patch  F/U PCP Dr Juel BurrowMasoud for evaliation of  BP and general medical  condition  F/U surgical evaluation. May consider pending follow-up evaluations  F/U neurological evaluation. May consider PNCV/EMG studies and other studies pending follow-up evaluations  TENS unit.  Use as discussed   May consider radiofrequency rhizolysis or intraspinal procedures pending response to present treatment and F/U evaluation area we will avoid such procedures at this time. We will avoid such treatment at this time  Patient to call [facility] should patient have concerns prior to scheduled return appointment.

## 2015-10-29 NOTE — Patient Instructions (Addendum)
PLAN   Continue present medication Norflex oxycodone and Flector patch  F/U PCP Dr Juel BurrowMasoud for evaliation of  BP and general medical  condition  F/U surgical evaluation. May consider pending follow-up evaluations  F/U neurological evaluation. May consider PNCV/EMG studies and other studies pending follow-up evaluations  TENS unit as discussed   May consider radiofrequency rhizolysis or intraspinal procedures pending response to present treatment and F/U evaluation area we will avoid such procedures at this time. We will avoid such treatment at this time  Patient to call [facility] should patient have concerns prior to scheduled return appointment.

## 2015-10-29 NOTE — Progress Notes (Signed)
Patient here for medication management Safety precautions to be maintained throughout the outpatient stay will include: orient to surroundings, keep bed in low position, maintain call bell within reach at all times, provide assistance with transfer out of bed and ambulation.  

## 2015-11-01 ENCOUNTER — Encounter: Payer: 59 | Admitting: Pain Medicine

## 2015-11-27 ENCOUNTER — Ambulatory Visit: Payer: 59 | Attending: Pain Medicine | Admitting: Pain Medicine

## 2015-11-27 ENCOUNTER — Encounter: Payer: Self-pay | Admitting: Pain Medicine

## 2015-11-27 ENCOUNTER — Other Ambulatory Visit: Payer: Self-pay | Admitting: Pain Medicine

## 2015-11-27 VITALS — BP 132/85 | HR 105 | Temp 98.4°F | Resp 16 | Ht 64.0 in | Wt 150.0 lb

## 2015-11-27 DIAGNOSIS — M5136 Other intervertebral disc degeneration, lumbar region: Secondary | ICD-10-CM | POA: Diagnosis not present

## 2015-11-27 DIAGNOSIS — G43009 Migraine without aura, not intractable, without status migrainosus: Secondary | ICD-10-CM

## 2015-11-27 DIAGNOSIS — M545 Low back pain: Secondary | ICD-10-CM | POA: Diagnosis present

## 2015-11-27 DIAGNOSIS — N809 Endometriosis, unspecified: Secondary | ICD-10-CM | POA: Insufficient documentation

## 2015-11-27 DIAGNOSIS — M533 Sacrococcygeal disorders, not elsewhere classified: Secondary | ICD-10-CM | POA: Diagnosis not present

## 2015-11-27 DIAGNOSIS — R51 Headache: Secondary | ICD-10-CM | POA: Diagnosis present

## 2015-11-27 DIAGNOSIS — M797 Fibromyalgia: Secondary | ICD-10-CM | POA: Diagnosis not present

## 2015-11-27 DIAGNOSIS — M7060 Trochanteric bursitis, unspecified hip: Secondary | ICD-10-CM

## 2015-11-27 DIAGNOSIS — M79606 Pain in leg, unspecified: Secondary | ICD-10-CM | POA: Diagnosis present

## 2015-11-27 DIAGNOSIS — M5481 Occipital neuralgia: Secondary | ICD-10-CM | POA: Diagnosis not present

## 2015-11-27 DIAGNOSIS — G43109 Migraine with aura, not intractable, without status migrainosus: Secondary | ICD-10-CM

## 2015-11-27 MED ORDER — DICLOFENAC EPOLAMINE 1.3 % TD PTCH: MEDICATED_PATCH | TRANSDERMAL | Status: DC

## 2015-11-27 MED ORDER — OXYCODONE HCL 10 MG PO TABS: ORAL_TABLET | ORAL | Status: DC

## 2015-11-27 NOTE — Patient Instructions (Addendum)
PLAN   Continue present medication Norflex oxycodone and Flector patch  Continue Topamax and increased Neurontin by 2 more pills per day if Dr. Juel BurrowMasoud agrees  F/U PCP Dr Juel BurrowMasoud for evaliation of  BP and general medical  condition  F/U surgical evaluation. May consider pending follow-up evaluations  F/U neurological evaluation. May consider PNCV/EMG studies and other studies pending follow-up evaluations  TENS unit as discussed   May consider radiofrequency rhizolysis or intraspinal procedures pending response to present treatment and F/U evaluation We will avoid such procedures at this time.  Patient to call [facility] Center should patient have concerns prior to scheduled return appointment.

## 2015-11-27 NOTE — Progress Notes (Signed)
   Subjective:    Patient ID: Isabel FudgeStacey S Robinson, female    DOB: 1975/12/19, 40 y.o.   MRN: [id number]  HPI  The patient is a 40 year old female who returns to pain management for further evaluation and treatment of pain which is involving headaches as well as pain of the lower back and lower extremity regions. On today's visit the patient stated that she had a new symptoms which included earning stinging sensation and cold sensation of the lower extremities. After further questioning patient admits to the pain being more prevalent at night. We discussed patient's condition and after review of medications decision was made to have patient increase Neurontin at this time. We discussed interventional treatment and will consider interventional treatment such as lumbar sympathetic block and other procedures if the increase of Neurontin is ineffective. The patient will obtain approval from Dr. Harl BowieMassoud to increase the Neurontin while she continues her other medications including Topamax oxycodone and Norflex. All agreed to suggested treatment plan  Review of Systems     Objective:   Physical Exam There was tenderness of the splenius capitis and occipitalis region palpation which reproduces mild discomfort. No masses of the head and neck were noted and no bounding of ulcerations of the temporal region were noted. Palpation of the cervical and thoracic facet regions reproduce moderate discomfort. The patient was able to perform drop test without significant difficulty with mild tenderness of the acromioclavicular and glenohumeral joint regions. The patient was with unremarkable Spurling's maneuver. Palpation over the region of the lumbar region was attends to palpation of moderately severe degree with lateral bending rotation extension and palpation of the lumbar facets reproducing moderately severe discomfort. There was moderate tenderness to moderately severe tenderness of the PSIS and PII S regions as well there  is mild to moderate tenderness of the greater trochanteric region iliotibial band region. Straight leg raise was tolerates approximately 30 without increase of pain with dorsiflexion noted. There was negative clonus negative Homans. Pulses of the lower extremities were palpable and there was no definite allodynia of the lower extremities noted. There was tenderness to palpation of the lower extremities noted. There was negative clonus negative Homans. Abdomen nontender with no costovertebral tenderness noted        Assessment & Plan:    Neuropathic versus vascular component of lower extremity pain  Degenerative disc disease lumbar spine  Lumbar facet syndrome  Fibromyalgia  Sacroiliac joint dysfunction  Bilateral occipital neuralgia  Greater trochanteric bursitis  Endometriosis      PLAN   Continue present medication Norflex oxycodone and Flector patch  Continue Topamax and  Increased Neurontin by 2 more pills per day if Dr. Juel BurrowMasoud agrees  Consider lumbar sympathetic block for lower extremity pain and paresthesias with concern regarding neuropathic pain as well as vascular component of pain  F/U PCP Dr Juel BurrowMasoud for evaliation of  BP and general medical  condition  F/U surgical evaluation. May consider pending follow-up evaluations  F/U neurological evaluation. May consider PNCV/EMG studies to evaluate for neuropathy and other abnormalities as well as consider other studies pending follow-up evaluations  May consider vascular evaluation of lower extremity pain  TENS unit as discussed   May consider radiofrequency rhizolysis or intraspinal procedures pending response to present treatment and F/U evaluation We will avoid such procedures at this time.  Patient to call [facility] should patient have concerns prior to scheduled return appointment.

## 2015-11-27 NOTE — Progress Notes (Signed)
Safety precautions to be maintained throughout the outpatient stay will include: orient to surroundings, keep bed in low position, maintain call bell within reach at all times, provide assistance with transfer out of bed and ambulation.  

## 2015-11-28 ENCOUNTER — Ambulatory Visit: Payer: 59 | Admitting: Pain Medicine

## 2015-11-29 ENCOUNTER — Encounter: Payer: 59 | Admitting: Pain Medicine

## 2015-12-12 ENCOUNTER — Telehealth: Payer: Self-pay | Admitting: Pain Medicine

## 2015-12-12 NOTE — Telephone Encounter (Signed)
Patient wants to see if Dr. Metta Clines will take over writing all her meds so she doesn't have to go to 2 diff phys. Each month to get med refills, co pays are extreme.

## 2015-12-12 NOTE — Telephone Encounter (Signed)
Nurses Please let me know which medications patient is referring to that she would like to have me begin to prescribe and we will consider prescribing medications once we have the opportunity to review the medications   Thank you

## 2015-12-13 NOTE — Telephone Encounter (Signed)
Message left on answering machine for Isabel Robinson to provide [place] with what medications she is wanting Dr. Metta Clines to prescribe. Will await her response.

## 2015-12-25 ENCOUNTER — Ambulatory Visit: Payer: 59 | Attending: Pain Medicine | Admitting: Pain Medicine

## 2015-12-25 ENCOUNTER — Encounter: Payer: Self-pay | Admitting: Pain Medicine

## 2015-12-25 VITALS — BP 122/76 | HR 86 | Temp 98.3°F | Ht 64.0 in | Wt 155.0 lb

## 2015-12-25 DIAGNOSIS — R200 Anesthesia of skin: Secondary | ICD-10-CM | POA: Insufficient documentation

## 2015-12-25 DIAGNOSIS — M533 Sacrococcygeal disorders, not elsewhere classified: Secondary | ICD-10-CM | POA: Diagnosis not present

## 2015-12-25 DIAGNOSIS — M7060 Trochanteric bursitis, unspecified hip: Secondary | ICD-10-CM | POA: Diagnosis not present

## 2015-12-25 DIAGNOSIS — M5481 Occipital neuralgia: Secondary | ICD-10-CM | POA: Diagnosis not present

## 2015-12-25 DIAGNOSIS — M545 Low back pain: Secondary | ICD-10-CM | POA: Diagnosis present

## 2015-12-25 DIAGNOSIS — M5136 Other intervertebral disc degeneration, lumbar region: Secondary | ICD-10-CM | POA: Insufficient documentation

## 2015-12-25 DIAGNOSIS — G43909 Migraine, unspecified, not intractable, without status migrainosus: Secondary | ICD-10-CM | POA: Diagnosis not present

## 2015-12-25 DIAGNOSIS — N809 Endometriosis, unspecified: Secondary | ICD-10-CM | POA: Insufficient documentation

## 2015-12-25 DIAGNOSIS — R51 Headache: Secondary | ICD-10-CM | POA: Diagnosis present

## 2015-12-25 DIAGNOSIS — M797 Fibromyalgia: Secondary | ICD-10-CM | POA: Diagnosis not present

## 2015-12-25 MED ORDER — TOPIRAMATE 25 MG PO CPSP: ORAL_CAPSULE | ORAL | 0 refills | Status: DC

## 2015-12-25 MED ORDER — TRAMADOL-ACETAMINOPHEN 37.5-325 MG PO TABS: 1.0000 | ORAL_TABLET | Freq: Four times a day (QID) | ORAL | 0 refills | Status: DC | PRN

## 2015-12-25 MED ORDER — TRAMADOL HCL 50 MG PO TABS: ORAL_TABLET | ORAL | 0 refills | Status: DC

## 2015-12-25 MED ORDER — ORPHENADRINE CITRATE ER 100 MG PO TB12: ORAL_TABLET | ORAL | 2 refills | Status: DC

## 2015-12-25 MED ORDER — DICLOFENAC EPOLAMINE 1.3 % TD PTCH: MEDICATED_PATCH | TRANSDERMAL | 0 refills | Status: DC

## 2015-12-25 MED ORDER — OXYCODONE HCL 10 MG PO TABS: ORAL_TABLET | ORAL | 0 refills | Status: DC

## 2015-12-25 NOTE — Progress Notes (Signed)
    The patient is a 40 year old female who returns to pain management for further evaluation and treatment of pain involving the lower back lower extremity region as well as headaches. The patient has had exacerbation of her headaches. The patient also has had complained of paresthesias of the lower extremities. The patient is follow-up with Dr. Harl BowieMassoud and has had modification of her medications. The patient continues Neurontin and Effexor Topamax oxycodone and Norflex. We discussed patient's condition on today's visit and decision was made to begin tramadol for breakthrough pain while patient is being considered for interventional treatment for treatment of headaches. We've discussed patient undergoing repair occipital nerve block stellate ganglion block and sphenopalatine blocks for treatment of headache. We'll remain available to consider modification of treatment regimen as discussed and will proceed with such treatment pending decision patient. All agreed to suggested treatment plan.     Physical examination  There was tenderness of the splenius capitis and occipitalis region of moderate to moderately severe degree with moderate to moderately severe tenderness to palpation of the cervical facet cervical paraspinal musculature region. There were no bounding pulsations of the temporal region noted. Palpation of the sinuses were without increased pain of significant degree. The patient was with reproduction of severe pain with palpation of the splenius capitis and occipitalis regions. Palpation of the acromioclavicular and glenohumeral joint regions reproduced minimal discomfort and patient appeared to be with bilaterally equal grip strength with Tinel and Phalen's maneuver reproducing minimal discomfort. Palpation of the thoracic region was attends to palpation of moderate degree with moderate muscle spasm involving the mid upper and lower thoracic paraspinal musculature region. Palpation over the  lumbar paraspinal musculature region lumbar facet region was attends to palpation with lateral bending rotation extension and palpation of the lumbar facets reproducing moderate discomfort. There was moderate tenderness of the greater trochanteric region iliotibial band regionsensory deficit or dermatomal distribution was detected. DTRs appeared to be trace at the knees. EHL strength appeared to be slightly decreased. There was negative clonus negative Homans. Abdomen nontender with no costovertebral angle tenderness noted.      Assessment    Neuropathic versus vascular component of lower extremity pain  Degenerative disc disease lumbar spine  Lumbar facet syndrome  Fibromyalgia  Sacroiliac joint dysfunction  Bilateral occipital neuralgia  Migraine headache  Greater trochanteric bursitis  Endometriosis       PLAN   Continue present medication Norflex oxycodone and Flector patch  Continue Topamax and and Neurontin as well as Effexor  F/U PCP Dr Juel BurrowMasoud for evaliation of  BP, headache, paresthesias of the lower extremities, and general medical  condition  F/U surgical evaluation. May consider pending follow-up evaluations  F/U neurological evaluation. May consider PNCV/EMG studies and other studies pending follow-up evaluations. Discuss neurological evaluation of headaches with Dr. Juel BurrowMasoud  TENS unit as discussed   May consider radiofrequency rhizolysis or intraspinal procedures pending response to present treatment and F/U evaluation We will avoid such procedures at this time.  Patient to call [facility] Center should patient have concerns prior to scheduled return appointment.

## 2015-12-25 NOTE — Patient Instructions (Addendum)
PLAN   Continue present medication Norflex oxycodone and Flector patch  Continue Topamax and and Neurontin as well as Effexor  F/U PCP Dr Juel BurrowMasoud for evaliation of  BP, headache, paresthesias of the lower extremities, and general medical  condition  F/U surgical evaluation. May consider pending follow-up evaluations  F/U neurological evaluation. May consider PNCV/EMG studies and other studies pending follow-up evaluations. Discuss neurological evaluation of headaches with Dr. Juel BurrowMasoud  TENS unit as discussed   May consider radiofrequency rhizolysis or intraspinal procedures pending response to present treatment and F/U evaluation We will avoid such procedures at this time.  Patient to call [facility] Center should patient have concerns prior to scheduled return appointment.

## 2015-12-25 NOTE — Progress Notes (Signed)
Safety precautions to be maintained throughout the outpatient stay will include: orient to surroundings, keep bed in low position, maintain call bell within reach at all times, provide assistance with transfer out of bed and ambulation.  

## 2016-01-15 ENCOUNTER — Telehealth: Payer: Self-pay | Admitting: *Deleted

## 2016-01-16 ENCOUNTER — Ambulatory Visit: Payer: BLUE CROSS/BLUE SHIELD | Attending: Pain Medicine | Admitting: Pain Medicine

## 2016-01-16 ENCOUNTER — Encounter: Payer: Self-pay | Admitting: Pain Medicine

## 2016-01-16 VITALS — BP 134/81 | HR 93 | Temp 98.1°F | Resp 16 | Ht 64.0 in | Wt 150.0 lb

## 2016-01-16 DIAGNOSIS — M5481 Occipital neuralgia: Secondary | ICD-10-CM | POA: Diagnosis not present

## 2016-01-16 DIAGNOSIS — G43909 Migraine, unspecified, not intractable, without status migrainosus: Secondary | ICD-10-CM | POA: Insufficient documentation

## 2016-01-16 DIAGNOSIS — M62830 Muscle spasm of back: Secondary | ICD-10-CM | POA: Insufficient documentation

## 2016-01-16 DIAGNOSIS — M797 Fibromyalgia: Secondary | ICD-10-CM | POA: Insufficient documentation

## 2016-01-16 DIAGNOSIS — R200 Anesthesia of skin: Secondary | ICD-10-CM | POA: Insufficient documentation

## 2016-01-16 DIAGNOSIS — G43009 Migraine without aura, not intractable, without status migrainosus: Secondary | ICD-10-CM

## 2016-01-16 DIAGNOSIS — M7060 Trochanteric bursitis, unspecified hip: Secondary | ICD-10-CM | POA: Diagnosis not present

## 2016-01-16 DIAGNOSIS — M533 Sacrococcygeal disorders, not elsewhere classified: Secondary | ICD-10-CM | POA: Insufficient documentation

## 2016-01-16 DIAGNOSIS — M79605 Pain in left leg: Secondary | ICD-10-CM | POA: Insufficient documentation

## 2016-01-16 DIAGNOSIS — M5136 Other intervertebral disc degeneration, lumbar region: Secondary | ICD-10-CM | POA: Insufficient documentation

## 2016-01-16 DIAGNOSIS — M79604 Pain in right leg: Secondary | ICD-10-CM | POA: Insufficient documentation

## 2016-01-16 DIAGNOSIS — M545 Low back pain: Secondary | ICD-10-CM | POA: Diagnosis present

## 2016-01-16 MED ORDER — OXYCODONE HCL 10 MG PO TABS: ORAL_TABLET | ORAL | 0 refills | Status: DC

## 2016-01-16 NOTE — Patient Instructions (Signed)
PLAN   Continue present medication Norflex oxycodone and Flector patch  Continue Topamax and and Neurontin as well as Effexor as discussed  F/U PCP Dr Juel BurrowMasoud for evaluation of  BP and general medical  condition  F/U surgical evaluation. May consider pending follow-up evaluations  F/U neurological evaluation. May consider PNCV/EMG studies and other studies pending follow-up evaluations. Discuss neurological evaluation of headaches with Dr. Juel BurrowMasoud  TENS unit as discussed   May consider radiofrequency rhizolysis or intraspinal procedures pending response to present treatment and F/U evaluation We will avoid such procedures at this time.  Patient to call [facility] should patient have concerns prior to scheduled return appointment.

## 2016-01-16 NOTE — Progress Notes (Signed)
       The patient is a 40 year old female who returns to pain management for further evaluation and treatment of pain involving the region of the mid lower back and lower extremity regions. Patient states the pain is fairly well-controlled present treatment regimen. The been concern regarding patient being with complaint of paresthesias of the extremities. The patient underwent follow-up evaluation with Dr. Juel BurrowMasoud and at the present time patient is without evidence of significant underlying medical condition which may be contributing to patient's symptomatology. We discussed patient's condition and patient will continue the care of her primary care physician Dr. Juel BurrowMasoud and may be considered for further evaluation including neurological and rheumatological evaluations pending disposition of Dr. Juel BurrowMasoud. We will continue patient's present medications Norflex oxycodone and Flector patch as well as Topamax and Neurontin at this time and we will remain available to consider modification of treatment regimen pending response to treatment and follow-up evaluation. All agreed to suggested treatment plan.     Physical examination  There was tenderness to palpation of paraspinal musculature in the cervical region cervical facet region palpation which reproduces pain of mild to moderate degree with mild to moderate tenderness of the splenius capitis and occipitalis regions. Palpation of the acromioclavicular and glenohumeral joint regions reproduced pain of mild degree. Patient is a perform drop test without significant difficulty. Tinel and Phalen's maneuver were without increased pain of significant degree and patient appeared to be with bilaterally equal grip strength Palpation over the region of the thoracic region was with tenderness to palpation with palpation over the thoracic region being with moderate muscle spasms involving the mid and lower thoracic region. There was no crepitus of the thoracic region  noted. Palpation over the lumbar paraspinal musculature region lumbar facet region was with tenderness to palpation of mild to moderate degree with lateral bending rotation extension and palpation of the lumbar facets reproducing mild to moderate discomfort. Palpation of the greater trochanteric region iliotibial band region reproduced pain of moderate degree to moderately severe degree. Straight leg raise was tolerates approximately 30 without increase of pain with dorsiflexion noted. No sensory deficit or dermatomal distribution detected. There was negative clonus negative Homans. Abdomen nontender with no costovertebral tenderness noted.      Assessment   Neuropathic versus vascular component of lower extremity pain  Degenerative disc disease lumbar spine  Lumbar facet syndrome  Fibromyalgia  Sacroiliac joint dysfunction  Bilateral occipital neuralgia  Migraine headache  Greater trochanteric bursitis      PLAN   Continue present medication Norflex oxycodone and Flector patch  Continue Topamax and and Neurontin as well as Effexor as discussed  F/U PCP Dr Juel BurrowMasoud for evaluation of  BP and general medical  condition  F/U surgical evaluation. May consider pending follow-up evaluations  F/U neurological evaluation. May consider PNCV/EMG studies and other studies pending follow-up evaluations. Discuss neurological evaluation of headaches with Dr. Juel BurrowMasoud  TENS unit as discussed   May consider radiofrequency rhizolysis or intraspinal procedures pending response to present treatment and F/U evaluation We will avoid such procedures at this time.  Patient to call Pain Management Center should patient have concerns prior to scheduled return appointment.  Endometriosis

## 2016-01-18 NOTE — Telephone Encounter (Signed)
Pt wanting RX filled early r/t going OOT . Dad is in hospital and will be with him. Rx due to be filled 01/4916. Dr Metta Clinesrisp states Ok to fill early but will need to make medications last to refill date. Informed pt of this and called pharm with premission to fill

## 2016-01-24 ENCOUNTER — Ambulatory Visit: Payer: 59 | Admitting: Pain Medicine

## 2016-01-29 ENCOUNTER — Telehealth: Payer: Self-pay | Admitting: *Deleted

## 2016-02-15 ENCOUNTER — Other Ambulatory Visit: Payer: Self-pay | Admitting: Internal Medicine

## 2016-02-15 DIAGNOSIS — H532 Diplopia: Secondary | ICD-10-CM

## 2016-02-15 DIAGNOSIS — G43801 Other migraine, not intractable, with status migrainosus: Secondary | ICD-10-CM

## 2016-02-28 ENCOUNTER — Ambulatory Visit: Payer: BLUE CROSS/BLUE SHIELD

## 2016-03-17 ENCOUNTER — Ambulatory Visit: Payer: BLUE CROSS/BLUE SHIELD | Admitting: Pain Medicine

## 2016-06-03 DIAGNOSIS — M792 Neuralgia and neuritis, unspecified: Secondary | ICD-10-CM | POA: Diagnosis not present

## 2016-06-03 DIAGNOSIS — M47817 Spondylosis without myelopathy or radiculopathy, lumbosacral region: Secondary | ICD-10-CM | POA: Diagnosis not present

## 2016-06-03 DIAGNOSIS — M5416 Radiculopathy, lumbar region: Secondary | ICD-10-CM | POA: Diagnosis not present

## 2016-07-01 DIAGNOSIS — M47817 Spondylosis without myelopathy or radiculopathy, lumbosacral region: Secondary | ICD-10-CM | POA: Diagnosis not present

## 2016-07-01 DIAGNOSIS — M792 Neuralgia and neuritis, unspecified: Secondary | ICD-10-CM | POA: Diagnosis not present

## 2016-07-01 DIAGNOSIS — M5416 Radiculopathy, lumbar region: Secondary | ICD-10-CM | POA: Diagnosis not present

## 2016-07-29 DIAGNOSIS — G894 Chronic pain syndrome: Secondary | ICD-10-CM | POA: Diagnosis not present

## 2016-07-29 DIAGNOSIS — Z79891 Long term (current) use of opiate analgesic: Secondary | ICD-10-CM | POA: Diagnosis not present

## 2016-07-29 DIAGNOSIS — M5416 Radiculopathy, lumbar region: Secondary | ICD-10-CM | POA: Diagnosis not present

## 2016-07-29 DIAGNOSIS — M792 Neuralgia and neuritis, unspecified: Secondary | ICD-10-CM | POA: Diagnosis not present

## 2016-07-29 DIAGNOSIS — M47817 Spondylosis without myelopathy or radiculopathy, lumbosacral region: Secondary | ICD-10-CM | POA: Diagnosis not present

## 2016-08-26 DIAGNOSIS — Z79891 Long term (current) use of opiate analgesic: Secondary | ICD-10-CM | POA: Diagnosis not present

## 2016-08-26 DIAGNOSIS — M5031 Other cervical disc degeneration,  high cervical region: Secondary | ICD-10-CM | POA: Diagnosis not present

## 2016-08-26 DIAGNOSIS — M542 Cervicalgia: Secondary | ICD-10-CM | POA: Diagnosis not present

## 2016-08-26 DIAGNOSIS — M5416 Radiculopathy, lumbar region: Secondary | ICD-10-CM | POA: Diagnosis not present

## 2016-08-26 DIAGNOSIS — M47817 Spondylosis without myelopathy or radiculopathy, lumbosacral region: Secondary | ICD-10-CM | POA: Diagnosis not present

## 2016-08-26 DIAGNOSIS — M792 Neuralgia and neuritis, unspecified: Secondary | ICD-10-CM | POA: Diagnosis not present

## 2016-09-01 IMAGING — CT CT HEAD WITHOUT CONTRAST
1 series · 16 of 27 positions shown, 20 images · non-contrast
Comparison: 04/26/2011.

CLINICAL DATA: Sleep walking [REDACTED]. Fell face port in bathroom.
Bruising to right frontal and right upper lip area. Difficulty
focusing.

EXAM:
CT HEAD WITHOUT CONTRAST
TECHNIQUE: Contiguous axial images were obtained from the base of the skull
through the vertex without intravenous contrast.

[Series 2: head wo · axial · 0.40mm/px · z∈[-81,+39]mm · 16 of 27 slices shown, 20 images]
[im 2/27  brain]
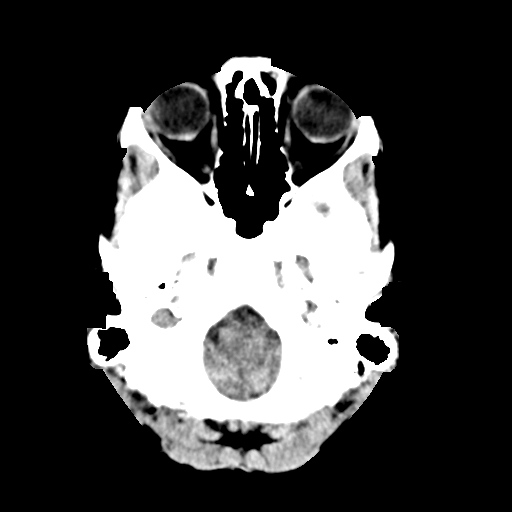
[im 2/27  bone]
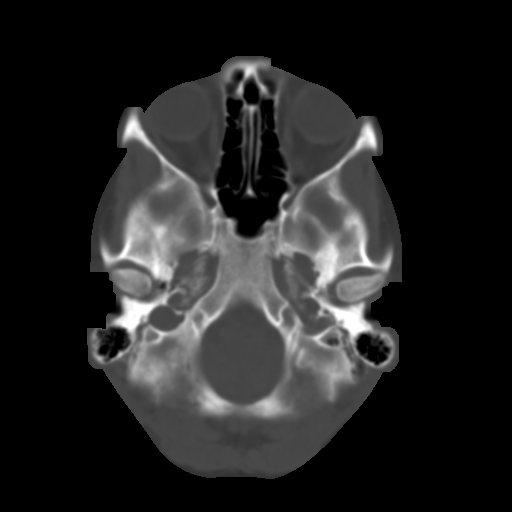
[im 4/27  brain]
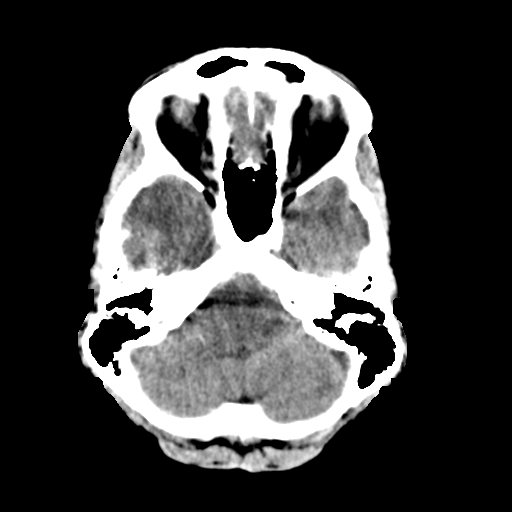
[im 5/27  brain]
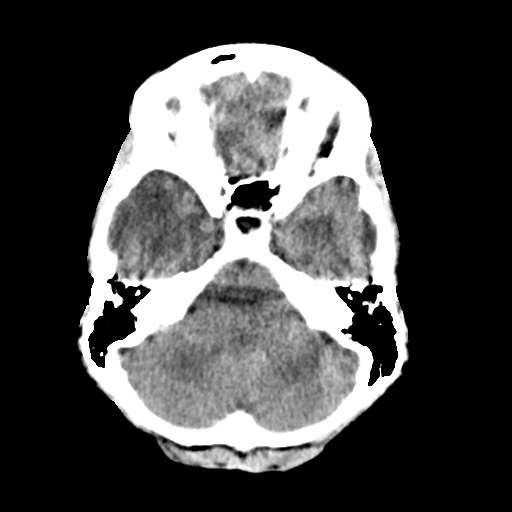
[im 7/27  brain]
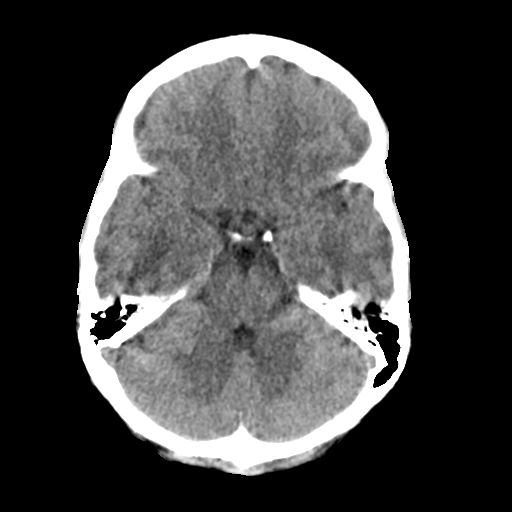
[im 9/27  brain]
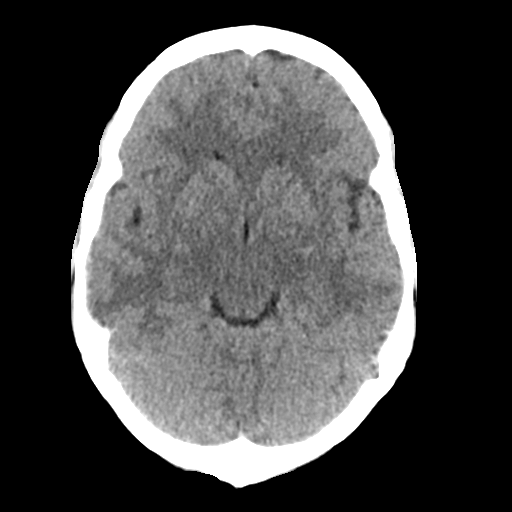
[im 9/27  bone]
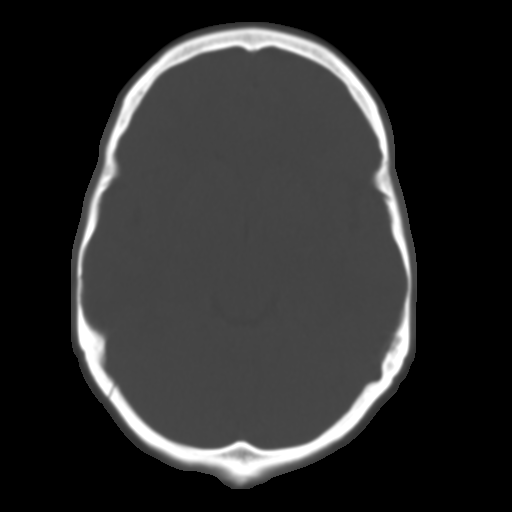
[im 10/27  brain]
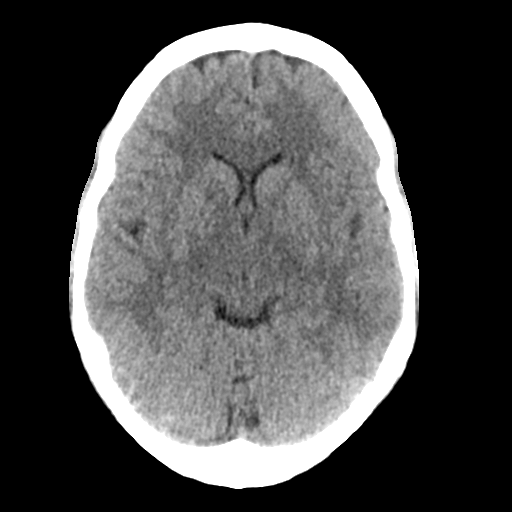
[im 12/27  brain]
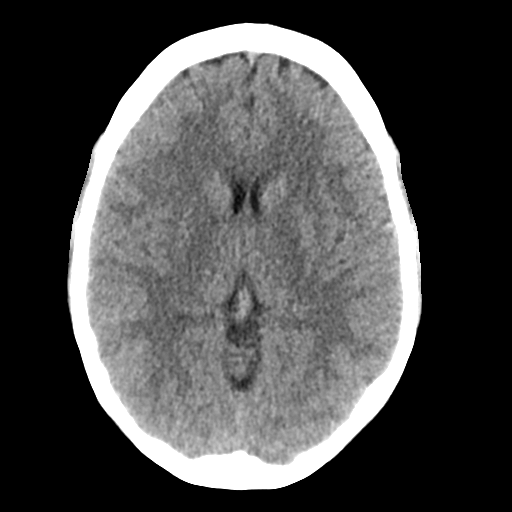
[im 13/27  brain]
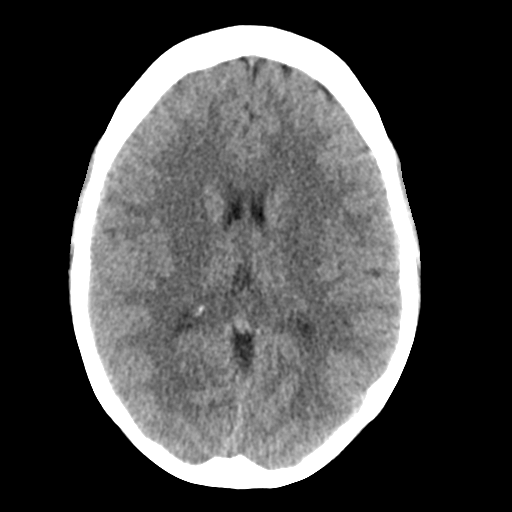
[im 15/27  brain]
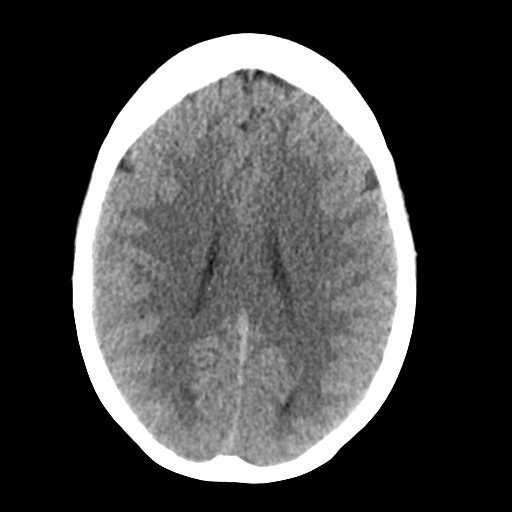
[im 15/27  bone]
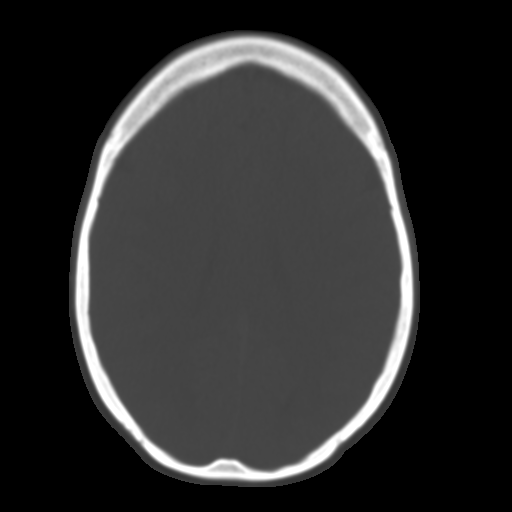
[im 16/27  brain]
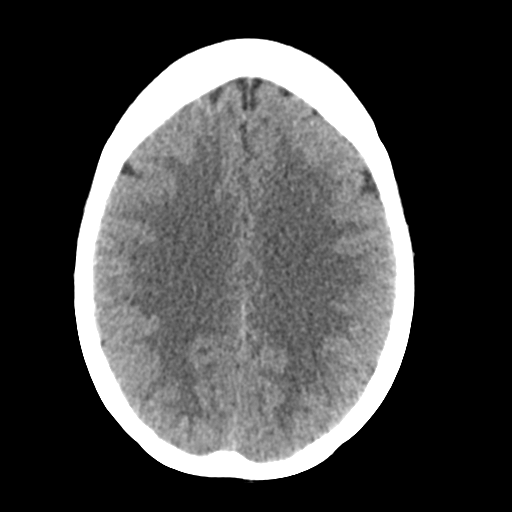
[im 18/27  brain]
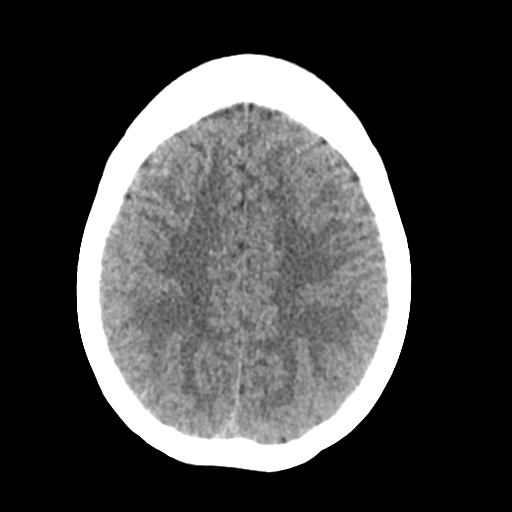
[im 19/27  brain]
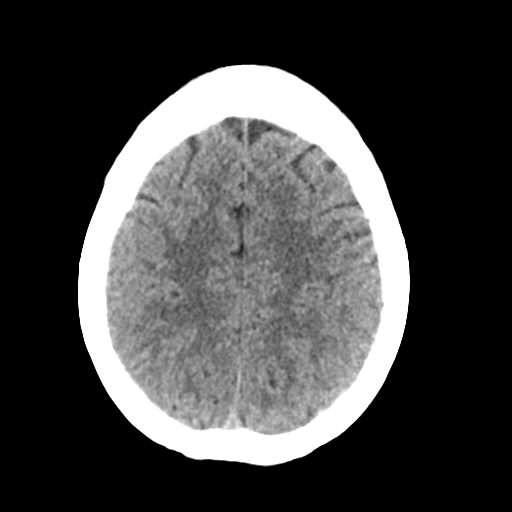
[im 21/27  brain]
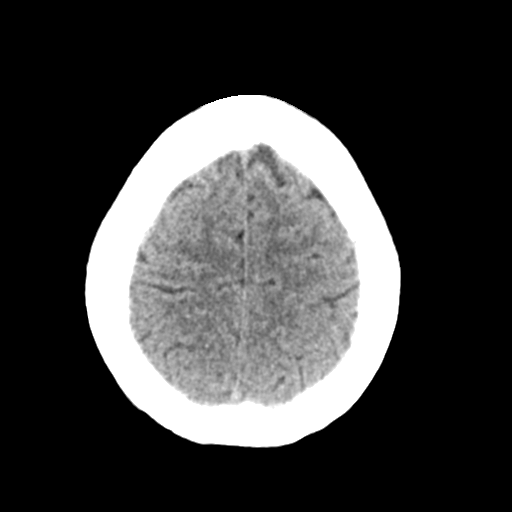
[im 21/27  bone]
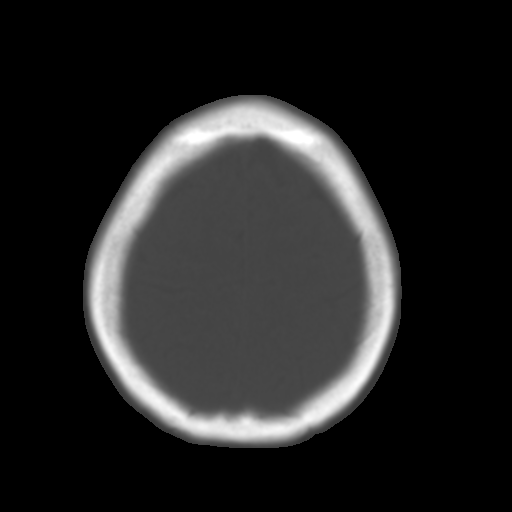
[im 23/27  brain]
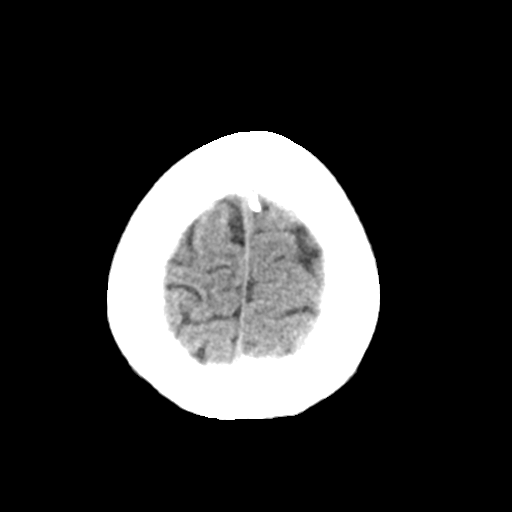
[im 24/27  brain]
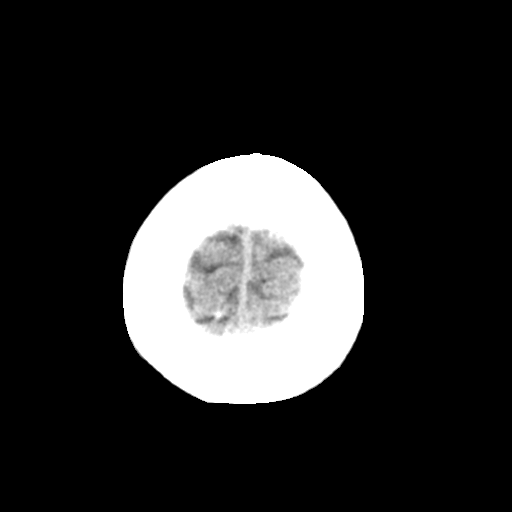
[im 26/27  brain]
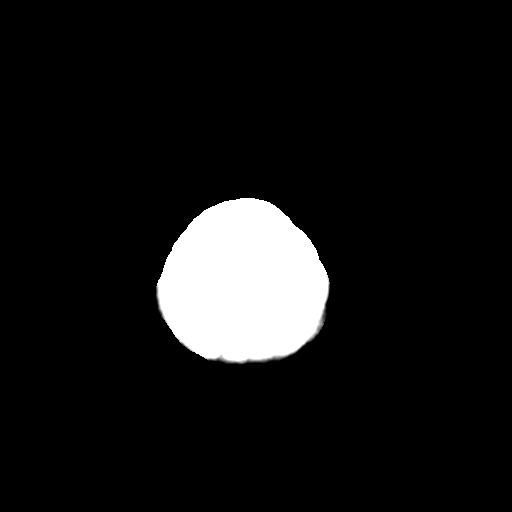

[16 of 27 positions shown; findings below may reference images not displayed]

FINDINGS: No acute cortical infarct, hemorrhage, or mass lesion ispresent.
Ventricles are of normal size. No significant extra-axial fluid
collection is present. The paranasal sinuses andmastoid air cells
are clear. The osseous skull is intact.
IMPRESSION: Negative exam.

## 2016-09-22 DIAGNOSIS — Z79891 Long term (current) use of opiate analgesic: Secondary | ICD-10-CM | POA: Diagnosis not present

## 2016-09-22 DIAGNOSIS — M47817 Spondylosis without myelopathy or radiculopathy, lumbosacral region: Secondary | ICD-10-CM | POA: Diagnosis not present

## 2016-09-22 DIAGNOSIS — M542 Cervicalgia: Secondary | ICD-10-CM | POA: Diagnosis not present

## 2016-09-22 DIAGNOSIS — M792 Neuralgia and neuritis, unspecified: Secondary | ICD-10-CM | POA: Diagnosis not present

## 2016-09-22 DIAGNOSIS — M5416 Radiculopathy, lumbar region: Secondary | ICD-10-CM | POA: Diagnosis not present

## 2016-09-22 DIAGNOSIS — M5031 Other cervical disc degeneration,  high cervical region: Secondary | ICD-10-CM | POA: Diagnosis not present

## 2016-10-20 DIAGNOSIS — M47817 Spondylosis without myelopathy or radiculopathy, lumbosacral region: Secondary | ICD-10-CM | POA: Diagnosis not present

## 2016-10-20 DIAGNOSIS — M542 Cervicalgia: Secondary | ICD-10-CM | POA: Diagnosis not present

## 2016-10-20 DIAGNOSIS — M5416 Radiculopathy, lumbar region: Secondary | ICD-10-CM | POA: Diagnosis not present

## 2016-10-20 DIAGNOSIS — Z79891 Long term (current) use of opiate analgesic: Secondary | ICD-10-CM | POA: Diagnosis not present

## 2016-10-20 DIAGNOSIS — M5031 Other cervical disc degeneration,  high cervical region: Secondary | ICD-10-CM | POA: Diagnosis not present

## 2016-10-20 DIAGNOSIS — M792 Neuralgia and neuritis, unspecified: Secondary | ICD-10-CM | POA: Diagnosis not present

## 2016-11-17 DIAGNOSIS — Z79891 Long term (current) use of opiate analgesic: Secondary | ICD-10-CM | POA: Diagnosis not present

## 2016-11-17 DIAGNOSIS — M542 Cervicalgia: Secondary | ICD-10-CM | POA: Diagnosis not present

## 2016-11-17 DIAGNOSIS — M792 Neuralgia and neuritis, unspecified: Secondary | ICD-10-CM | POA: Diagnosis not present

## 2016-11-17 DIAGNOSIS — M5031 Other cervical disc degeneration,  high cervical region: Secondary | ICD-10-CM | POA: Diagnosis not present

## 2016-11-17 DIAGNOSIS — M5416 Radiculopathy, lumbar region: Secondary | ICD-10-CM | POA: Diagnosis not present

## 2016-11-17 DIAGNOSIS — M47817 Spondylosis without myelopathy or radiculopathy, lumbosacral region: Secondary | ICD-10-CM | POA: Diagnosis not present

## 2016-12-15 DIAGNOSIS — M792 Neuralgia and neuritis, unspecified: Secondary | ICD-10-CM | POA: Diagnosis not present

## 2016-12-15 DIAGNOSIS — Z79891 Long term (current) use of opiate analgesic: Secondary | ICD-10-CM | POA: Diagnosis not present

## 2016-12-15 DIAGNOSIS — M5416 Radiculopathy, lumbar region: Secondary | ICD-10-CM | POA: Diagnosis not present

## 2016-12-15 DIAGNOSIS — M5031 Other cervical disc degeneration,  high cervical region: Secondary | ICD-10-CM | POA: Diagnosis not present

## 2016-12-15 DIAGNOSIS — M47817 Spondylosis without myelopathy or radiculopathy, lumbosacral region: Secondary | ICD-10-CM | POA: Diagnosis not present

## 2016-12-15 DIAGNOSIS — M542 Cervicalgia: Secondary | ICD-10-CM | POA: Diagnosis not present

## 2016-12-23 DIAGNOSIS — M792 Neuralgia and neuritis, unspecified: Secondary | ICD-10-CM | POA: Diagnosis not present

## 2016-12-23 DIAGNOSIS — M5416 Radiculopathy, lumbar region: Secondary | ICD-10-CM | POA: Diagnosis not present

## 2016-12-23 DIAGNOSIS — M47817 Spondylosis without myelopathy or radiculopathy, lumbosacral region: Secondary | ICD-10-CM | POA: Diagnosis not present

## 2017-01-12 DIAGNOSIS — M47817 Spondylosis without myelopathy or radiculopathy, lumbosacral region: Secondary | ICD-10-CM | POA: Diagnosis not present

## 2017-01-12 DIAGNOSIS — M5416 Radiculopathy, lumbar region: Secondary | ICD-10-CM | POA: Diagnosis not present

## 2017-01-12 DIAGNOSIS — M792 Neuralgia and neuritis, unspecified: Secondary | ICD-10-CM | POA: Diagnosis not present

## 2017-02-09 DIAGNOSIS — M47817 Spondylosis without myelopathy or radiculopathy, lumbosacral region: Secondary | ICD-10-CM | POA: Diagnosis not present

## 2017-02-09 DIAGNOSIS — G894 Chronic pain syndrome: Secondary | ICD-10-CM | POA: Diagnosis not present

## 2017-02-09 DIAGNOSIS — M5416 Radiculopathy, lumbar region: Secondary | ICD-10-CM | POA: Diagnosis not present

## 2017-02-09 DIAGNOSIS — R51 Headache: Secondary | ICD-10-CM | POA: Diagnosis not present

## 2017-02-09 DIAGNOSIS — G43909 Migraine, unspecified, not intractable, without status migrainosus: Secondary | ICD-10-CM | POA: Diagnosis not present

## 2017-03-09 DIAGNOSIS — G43909 Migraine, unspecified, not intractable, without status migrainosus: Secondary | ICD-10-CM | POA: Diagnosis not present

## 2017-03-09 DIAGNOSIS — R51 Headache: Secondary | ICD-10-CM | POA: Diagnosis not present

## 2017-03-09 DIAGNOSIS — G894 Chronic pain syndrome: Secondary | ICD-10-CM | POA: Diagnosis not present

## 2017-03-25 DIAGNOSIS — Z23 Encounter for immunization: Secondary | ICD-10-CM | POA: Diagnosis not present

## 2017-04-06 DIAGNOSIS — R51 Headache: Secondary | ICD-10-CM | POA: Diagnosis not present

## 2017-04-06 DIAGNOSIS — G894 Chronic pain syndrome: Secondary | ICD-10-CM | POA: Diagnosis not present

## 2017-04-06 DIAGNOSIS — G43909 Migraine, unspecified, not intractable, without status migrainosus: Secondary | ICD-10-CM | POA: Diagnosis not present

## 2017-04-17 DIAGNOSIS — G43909 Migraine, unspecified, not intractable, without status migrainosus: Secondary | ICD-10-CM | POA: Diagnosis not present

## 2017-04-17 DIAGNOSIS — R51 Headache: Secondary | ICD-10-CM | POA: Diagnosis not present

## 2017-04-17 DIAGNOSIS — G894 Chronic pain syndrome: Secondary | ICD-10-CM | POA: Diagnosis not present

## 2017-05-22 DIAGNOSIS — R51 Headache: Secondary | ICD-10-CM | POA: Diagnosis not present

## 2017-05-22 DIAGNOSIS — G894 Chronic pain syndrome: Secondary | ICD-10-CM | POA: Diagnosis not present

## 2017-05-22 DIAGNOSIS — G43909 Migraine, unspecified, not intractable, without status migrainosus: Secondary | ICD-10-CM | POA: Diagnosis not present

## 2017-06-22 DIAGNOSIS — G43909 Migraine, unspecified, not intractable, without status migrainosus: Secondary | ICD-10-CM | POA: Diagnosis not present

## 2017-06-22 DIAGNOSIS — R51 Headache: Secondary | ICD-10-CM | POA: Diagnosis not present

## 2017-06-22 DIAGNOSIS — G894 Chronic pain syndrome: Secondary | ICD-10-CM | POA: Diagnosis not present

## 2017-07-20 DIAGNOSIS — G894 Chronic pain syndrome: Secondary | ICD-10-CM | POA: Diagnosis not present

## 2017-07-20 DIAGNOSIS — G43909 Migraine, unspecified, not intractable, without status migrainosus: Secondary | ICD-10-CM | POA: Diagnosis not present

## 2017-07-20 DIAGNOSIS — R51 Headache: Secondary | ICD-10-CM | POA: Diagnosis not present

## 2017-08-10 DIAGNOSIS — G43709 Chronic migraine without aura, not intractable, without status migrainosus: Secondary | ICD-10-CM | POA: Diagnosis not present

## 2017-08-10 DIAGNOSIS — M7989 Other specified soft tissue disorders: Secondary | ICD-10-CM | POA: Diagnosis not present

## 2017-08-10 DIAGNOSIS — R5381 Other malaise: Secondary | ICD-10-CM | POA: Diagnosis not present

## 2017-08-10 DIAGNOSIS — E7849 Other hyperlipidemia: Secondary | ICD-10-CM | POA: Diagnosis not present

## 2017-08-10 DIAGNOSIS — R42 Dizziness and giddiness: Secondary | ICD-10-CM | POA: Diagnosis not present

## 2017-08-17 DIAGNOSIS — G43909 Migraine, unspecified, not intractable, without status migrainosus: Secondary | ICD-10-CM | POA: Diagnosis not present

## 2017-08-17 DIAGNOSIS — R51 Headache: Secondary | ICD-10-CM | POA: Diagnosis not present

## 2017-08-17 DIAGNOSIS — G894 Chronic pain syndrome: Secondary | ICD-10-CM | POA: Diagnosis not present

## 2017-08-27 ENCOUNTER — Encounter: Payer: Self-pay | Admitting: Obstetrics and Gynecology

## 2017-08-27 ENCOUNTER — Encounter: Payer: BLUE CROSS/BLUE SHIELD | Admitting: Obstetrics and Gynecology

## 2017-08-27 ENCOUNTER — Ambulatory Visit: Payer: 59 | Admitting: Obstetrics and Gynecology

## 2017-08-27 VITALS — BP 105/68 | HR 97 | Ht 64.0 in | Wt 157.8 lb

## 2017-08-27 DIAGNOSIS — N882 Stricture and stenosis of cervix uteri: Secondary | ICD-10-CM

## 2017-08-27 DIAGNOSIS — R102 Pelvic and perineal pain: Secondary | ICD-10-CM | POA: Diagnosis not present

## 2017-08-27 DIAGNOSIS — N920 Excessive and frequent menstruation with regular cycle: Secondary | ICD-10-CM | POA: Diagnosis not present

## 2017-08-27 DIAGNOSIS — N946 Dysmenorrhea, unspecified: Secondary | ICD-10-CM | POA: Diagnosis not present

## 2017-08-27 DIAGNOSIS — Z842 Family history of other diseases of the genitourinary system: Secondary | ICD-10-CM | POA: Insufficient documentation

## 2017-08-27 DIAGNOSIS — Z9889 Other specified postprocedural states: Secondary | ICD-10-CM

## 2017-08-27 NOTE — Patient Instructions (Signed)
1.  Endometrial biopsy is performed today. 2.  Laparoscopy with peritoneal biopsies is scheduled today 3.  Return the week before surgery for preop appointment and review of biopsy results   Endometrial Biopsy, Care After This sheet gives you information about how to care for yourself after your procedure. Your health care provider may also give you more specific instructions. If you have problems or questions, contact your health care provider. What can I expect after the procedure? After the procedure, it is common to have:  Mild cramping.  A small amount of vaginal bleeding for a few days. This is normal.  Follow these instructions at home:  Take over-the-counter and prescription medicines only as told by your health care provider.  Do not douche, use tampons, or have sexual intercourse until your health care provider approves.  Return to your normal activities as told by your health care provider. Ask your health care provider what activities are safe for you.  Follow instructions from your health care provider about any activity restrictions, such as restrictions on strenuous exercise or heavy lifting. Contact a health care provider if:  You have heavy bleeding, or bleed for longer than 2 days after the procedure.  You have bad smelling discharge from your vagina.  You have a fever or chills.  You have a burning sensation when urinating or you have difficulty urinating.  You have severe pain in your lower abdomen. Get help right away if:  You have severe cramps in your stomach or back.  You pass large blood clots.  Your bleeding increases.  You become weak or light-headed, or you pass out. Summary  After the procedure, it is common to have mild cramping and a small amount of vaginal bleeding for a few days.  Do not douche, use tampons, or have sexual intercourse until your health care provider approves.  Return to your normal activities as told by your health  care provider. Ask your health care provider what activities are safe for you. This information is not intended to replace advice given to you by your health care provider. Make sure you discuss any questions you have with your health care provider. Document Released: 02/23/2013 Document Revised: 05/21/2016 Document Reviewed: 05/21/2016 Elsevier Interactive Patient Education  2017 [employer] Diagnostic Laparoscopy A diagnostic laparoscopy is a procedure to diagnose diseases in the abdomen. During the procedure, a thin, lighted, pencil-sized instrument called a laparoscope is inserted into the abdomen through an incision. The laparoscope allows your health care provider to look at the organs inside your body. Tell a health care provider about:  Any allergies you have.  All medicines you are taking, including vitamins, herbs, eye drops, creams, and over-the-counter medicines.  Any problems you or family members have had with anesthetic medicines.  Any blood disorders you have.  Any surgeries you have had.  Any medical conditions you have. What are the risks? Generally, this is a safe procedure. However, problems can occur, which may include:  Infection.  Bleeding.  Damage to other organs.  Allergic reaction to the anesthetics used during the procedure.  What happens before the procedure?  Do not eat or drink anything after midnight on the night before the procedure or as directed by your health care provider.  Ask your health care provider about: ? Changing or stopping your regular medicines. ? Taking medicines such as aspirin and ibuprofen. These medicines can thin your blood. Do not take these medicines before your procedure if your health care provider  instructs you not to.  Plan to have someone take you home after the procedure. What happens during the procedure?  You may be given a medicine to help you relax (sedative).  You will be given a medicine to make you sleep  (general anesthetic).  Your abdomen will be inflated with a gas. This will make your organs easier to see.  Small incisions will be made in your abdomen.  A laparoscope and other small instruments will be inserted into the abdomen through the incisions.  A tissue sample may be removed from an organ in the abdomen for examination.  The instruments will be removed from the abdomen.  The gas will be released.  The incisions will be closed with stitches (sutures). What happens after the procedure? Your blood pressure, heart rate, breathing rate, and blood oxygen level will be monitored often until the medicines you were given have worn off. This information is not intended to replace advice given to you by your health care provider. Make sure you discuss any questions you have with your health care provider. Document Released: 08/11/2000 Document Revised: 09/13/2015 Document Reviewed: 12/16/2013 Elsevier Interactive Patient Education  [employer].  Endometriosis Endometriosis is a condition in which the tissue that lines the uterus (endometrium) grows outside of its normal location. The tissue may grow in many locations close to the uterus, but it commonly grows on the ovaries, fallopian tubes, vagina, or bowel. When the uterus sheds the endometrium every menstrual cycle, there is bleeding wherever the endometrial tissue is located. This can cause pain because blood is irritating to tissues that are not normally exposed to it. What are the causes? The cause of endometriosis is not known. What increases the risk? You may be more likely to develop endometriosis if you:  Have a family history of endometriosis.  Have never given birth.  Started your period at age 55 or younger.  Have high levels of estrogen in your body.  Were exposed to a certain medicine (diethylstilbestrol) before you were born (in utero).  Had low birth weight.  Were born as a twin, triplet, or other  multiple.  Have a BMI of less than 25. BMI is an estimate of body fat and is calculated from height and weight.  What are the signs or symptoms? Often, there are no symptoms of this condition. If you do have symptoms, they may:  Vary depending on where your endometrial tissue is growing.  Occur during your menstrual period (most common) or midcycle.  Come and go, or you may go months with no symptoms at all.  Stop with menopause.  Symptoms may include:  Pain in the back or abdomen.  Heavier bleeding during periods.  Pain during sex.  Painful bowel movements.  Infertility.  Pelvic pain.  Bleeding more than once a month.  How is this diagnosed? This condition is diagnosed based on your symptoms and a physical exam. You may have tests, such as:  Blood tests and urine tests. These may be done to help rule out other possible causes of your symptoms.  Ultrasound, to look for abnormal tissues.  An X-ray of the lower bowel (barium enema).  An ultrasound that is done through the vagina (transvaginally).  CT scan.  MRI.  Laparoscopy. In this procedure, a lighted, pencil-sized instrument called a laparoscope is inserted into your abdomen through an incision. The laparoscope allows your health care provider to look at the organs inside your body and check for abnormal tissue to confirm  the diagnosis. If abnormal tissue is found, your health care provider may remove a small piece of tissue (biopsy) to be examined under a microscope.  How is this treated? Treatment for this condition may include:  Medicines to relieve pain, such as NSAIDs.  Hormone therapy. This involves using artificial (synthetic) hormones to reduce endometrial tissue growth. Your health care provider may recommend using a hormonal form of birth control, or other medicines.  Surgery. This may be done to remove abnormal endometrial tissue. ? In some cases, tissue may be removed using a laparoscope and a  laser (laparoscopic laser treatment). ? In severe cases, surgery may be done to remove the fallopian tubes, uterus, and ovaries (hysterectomy).  Follow these instructions at home:  Take over-the-counter and prescription medicines only as told by your health care provider.  Do not drive or use heavy machinery while taking prescription pain medicine.  Try to avoid activities that cause pain, including sexual activity.  Keep all follow-up visits as told by your health care provider. This is important. Contact a health care provider if:  You have pain in the area between your hip bones (pelvic area) that occurs: ? Before, during, or after your period. ? In between your period and gets worse during your period. ? During or after sex. ? With bowel movements or urination, especially during your period.  You have problems getting pregnant.  You have a fever. Get help right away if:  You have severe pain that does not get better with medicine.  You have severe nausea and vomiting, or you cannot eat without vomiting.  You have pain that affects only the lower, right side of your abdomen.  You have abdominal pain that gets worse.  You have abdominal swelling.  You have blood in your stool. This information is not intended to replace advice given to you by your health care provider. Make sure you discuss any questions you have with your health care provider. Document Released: 05/02/2000 Document Revised: 02/08/2016 Document Reviewed: 10/06/2015 Elsevier Interactive Patient Education  [employer]2018 [employer]

## 2017-08-27 NOTE — Progress Notes (Signed)
GYN ENCOUNTER NOTE  Subjective:       Isabel Robinson is a 42 y.o. G85P1001 female is here for gynecologic evaluation of the following issues:  1.  Menorrhagia 2.  Worsening dysmenorrhea  42 year old white female para 1001, using vasectomy for contraception, family history of endometriosis, presents for evaluation of worsening menorrhagia and dysmenorrhea.  Previously seen in November 2016, patient was scheduled for laparoscopy to rule out endometriosis; however, the procedure was never completed and the patient was lost to follow-up until this time. Patient states that her dysmenorrhea has been progressively worsening with the need for 800 mg of ibuprofen 4 times a day along with Tylenol for control.  Menses are 7-8 days in duration with clots.  She has not had endometrial sampling. There is a family history of endometriosis in mom.   Gynecologic History Menarche age 68. Intervals are monthly. Duration of flow 7-8 days. History of dysmenorrhea, severe.  The first day, requiring 4 Advil 4 times a day. Dysmenorrhea is notable for low back pain and central cramping with the sense that her bottom is falling out. No history of deep thrusting dyspareunia. History of menorrhagia with clotting. Mom has endometriosis  Obstetric History OB History  Gravida Para Term Preterm AB Living  1 1 1     1   SAB TAB Ectopic Multiple Live Births          1    # Outcome Date GA Lbr Len/2nd Weight Sex Delivery Anes PTL Lv  1 Term 2000   8 lb 1.9 oz (3.683 kg) M Vag-Spont   LIV    Past Medical History:  Diagnosis Date  . Anemia   . Anxiety   . Depression   . Endometriosis   . Fibromyalgia   . Heart murmur   . Rash    scalp    Past Surgical History:  Procedure Laterality Date  . CHOLECYSTECTOMY      Current Outpatient Medications on File Prior to Visit  Medication Sig Dispense Refill  . acetaminophen (TYLENOL 8 HOUR ARTHRITIS PAIN) 650 MG CR tablet Take 650 mg by mouth 2 (two) times daily.     . ARIPiprazole (ABILIFY) 10 MG tablet     . diazepam (VALIUM) 5 MG tablet Take 5 mg by mouth 2 (two) times daily as needed for anxiety.    . DULoxetine (CYMBALTA) 60 MG capsule Take by mouth.    Marland Kitchen ibuprofen (ADVIL,MOTRIN) 200 MG tablet Take by mouth as needed.     Marland Kitchen omeprazole (PRILOSEC) 20 MG capsule Take 20 mg by mouth 2 (two) times daily before a meal.    . Oxycodone HCl 10 MG TABS Limit 1 tab by mouth 4-6 times per day if tolerated 180 tablet 0  . tizanidine (ZANAFLEX) 2 MG capsule LIMIT 1 CAPSULE BY MOUTH DAILY.MAY TAKE 2 IF TOLERATED  0  . topiramate (TOPAMAX) 25 MG capsule Limit 1-2 tablets by mouth twice per day if tolerated 100 capsule 0  . venlafaxine (EFFEXOR) 75 MG tablet Take 75 mg by mouth. 2 tablets bid     No current facility-administered medications on file prior to visit.     No Known Allergies  Social History   Socioeconomic History  . Marital status: Married    Spouse name: Not on file  . Number of children: Not on file  . Years of education: Not on file  . Highest education level: Not on file  Occupational History  . Not on file  Social Needs  .  Financial resource strain: Not on file  . Food insecurity:    Worry: Not on file    Inability: Not on file  . Transportation needs:    Medical: Not on file    Non-medical: Not on file  Tobacco Use  . Smoking status: Former Games developer  . Smokeless tobacco: Current User  Substance and Sexual Activity  . Alcohol use: Yes    Alcohol/week: 0.0 oz    Comment: occasionally  . Drug use: No  . Sexual activity: Not Currently    Birth control/protection: Other-see comments    Comment: vasectomy  Lifestyle  . Physical activity:    Days per week: Not on file    Minutes per session: Not on file  . Stress: Not on file  Relationships  . Social connections:    Talks on phone: Not on file    Gets together: Not on file    Attends religious service: Not on file    Active member of club or organization: Not on file     Attends meetings of clubs or organizations: Not on file    Relationship status: Not on file  . Intimate partner violence:    Fear of current or ex partner: Not on file    Emotionally abused: Not on file    Physically abused: Not on file    Forced sexual activity: Not on file  Other Topics Concern  . Not on file  Social History Narrative  . Not on file    Family History  Problem Relation Age of Onset  . Depression Mother   . Arthritis Mother   . Hypertension Mother   . Endometriosis Mother   . Heart disease Father   . Deep vein thrombosis Father   . Hypertension Father   . Diabetes Father   . Ovarian cancer Neg Hx   . Breast cancer Neg Hx     The following portions of the patient's history were reviewed and updated as appropriate: allergies, current medications, past family history, past medical history, past social history, past surgical history and problem list.  Review of Systems Review of Systems  Constitutional: Negative.   HENT: Negative.   Eyes: Negative.   Respiratory: Negative.   Cardiovascular: Negative.   Gastrointestinal: Negative.   Genitourinary:       Menorrhagia, worsening Severe dysmenorrhea, worsening  Musculoskeletal: Negative.   Skin: Negative.   Neurological: Negative.   Endo/Heme/Allergies: Negative.   Psychiatric/Behavioral: Positive for depression.     Objective:   BP 105/68   Pulse 97   Ht 5\' 4"  (1.626 m)   Wt 157 lb 12.8 oz (71.6 kg)   LMP 08/27/2017 (Exact Date)   BMI 27.09 kg/m  CONSTITUTIONAL: Well-developed, well-nourished female in no acute distress.  HENT:  Normocephalic, atraumatic.  NECK: Not examined SKIN: Skin is warm and dry. No rash noted. Not diaphoretic. No erythema. No pallor. NEUROLGIC: Alert and oriented to person, place, and time. PSYCHIATRIC: Normal mood and affect. Normal behavior. Normal judgment and thought content. CARDIOVASCULAR:Not Examined RESPIRATORY: Not Examined BREASTS: Not Examined ABDOMEN: Soft,  non distended; Non tender.  No Organomegaly.  No pelvic mass PELVIC:  External Genitalia: Normal  BUS: Normal  Vagina: Normal  Cervix: No lesions; stenotic; cervical motion tenderness 3/4  Uterus: Normal size, shape,consistency, mobile, midplane, tender 3/4  Adnexa: Normal; nonpalpable nontender  RV: Normal external exam  Bladder: Nontender MUSCULOSKELETAL: Normal range of motion. No tenderness.  No cyanosis, clubbing, or edema.     Assessment:  1. Dysmenorrhea  2. Menorrhagia with regular cycle  3. Pelvic pain in female  4. Family history of endometriosis in first degree relative  5. Cervical stenosis (uterine cervix)  6. History of cryosurgery     Plan:   1.  Endometrial biopsy 2.  Schedule laparoscopy with peritoneal biopsies and excision and fulguration of endometriosis 3.  Return for preop appointment prior to surgery 4.  Post endometrial biopsy instructions are given  Herold HarmsMartin A , MD  Note: This dictation was prepared with Dragon dictation along with smaller phrase technology. Any transcriptional errors that result from this process are unintentional.  Note: This dictation was prepared with Dragon dictation along with smaller phrase technology. Any transcriptional errors that result from this process are unintentional.  Endometrial Biopsy Procedure Note  Pre-operative Diagnosis: Menorrhagia; status post cryosurgery for dysplasia  Post-operative Diagnosis: Menorrhagia; status post cryosurgery for dysplasia; cervical stenosis  Procedure Details   Urine pregnancy test was not done.  The risks (including infection, bleeding, pain, and uterine perforation) and benefits of the procedure were explained to the patient and Verbal informed consent was obtained.  Antibiotic prophylaxis against endocarditis was not indicated.   The patient was placed in the dorsal lithotomy position.  Bimanual exam showed the uterus to be in the neutral position.  A  Graves' speculum inserted in the vagina.  Endocervical curettage with a Kevorkian curette was not performed.   A sharp tenaculum was applied to the anterior lip of the cervix for stabilization.  A sterile uterine sound could not be passed to measure the endometrial cavity; subsequently lacrimal duct probes are used to perform endocervical canal dilation until a 10 mm pipette was able to be passed; 3 mm pipette was inserted to a depth of 8 cm.  A Mylex 3mm curette was used to sample the endometrium.  Sample was sent for pathologic examination.  Condition: Stable  Complications: None  Plan:  The patient was advised to call for any fever or for prolonged or severe pain or bleeding. She was advised to use OTC acetaminophen and OTC ibuprofen as needed for mild to moderate pain. She was advised to avoid vaginal intercourse for 48 hours or until the bleeding has completely stopped.  Attending Physician Documentation: Herold HarmsMartin A , MD

## 2017-08-28 DIAGNOSIS — N920 Excessive and frequent menstruation with regular cycle: Secondary | ICD-10-CM | POA: Diagnosis not present

## 2017-08-28 NOTE — Addendum Note (Signed)
Addended by: Marchelle FolksMILLER, Langford Carias G on: 08/28/2017 11:22 AM   Modules accepted: Orders

## 2017-09-01 LAB — PATHOLOGY

## 2017-09-14 DIAGNOSIS — G43909 Migraine, unspecified, not intractable, without status migrainosus: Secondary | ICD-10-CM | POA: Diagnosis not present

## 2017-09-14 DIAGNOSIS — G894 Chronic pain syndrome: Secondary | ICD-10-CM | POA: Diagnosis not present

## 2017-09-14 DIAGNOSIS — R51 Headache: Secondary | ICD-10-CM | POA: Diagnosis not present

## 2017-09-15 ENCOUNTER — Encounter: Payer: Self-pay | Admitting: Obstetrics and Gynecology

## 2017-09-15 ENCOUNTER — Ambulatory Visit (INDEPENDENT_AMBULATORY_CARE_PROVIDER_SITE_OTHER): Payer: 59 | Admitting: Obstetrics and Gynecology

## 2017-09-15 VITALS — BP 120/79 | HR 85 | Ht 64.0 in | Wt 159.2 lb

## 2017-09-15 DIAGNOSIS — R102 Pelvic and perineal pain unspecified side: Secondary | ICD-10-CM

## 2017-09-15 DIAGNOSIS — Z842 Family history of other diseases of the genitourinary system: Secondary | ICD-10-CM

## 2017-09-15 DIAGNOSIS — N946 Dysmenorrhea, unspecified: Secondary | ICD-10-CM

## 2017-09-15 DIAGNOSIS — Z01818 Encounter for other preprocedural examination: Secondary | ICD-10-CM

## 2017-09-15 DIAGNOSIS — N920 Excessive and frequent menstruation with regular cycle: Secondary | ICD-10-CM

## 2017-09-15 NOTE — H&P (View-Only) (Signed)
PREOPERATIVE HISTORY AND PHYSICAL  Date of surgery: 09/21/2017 Diagnosis: 1.  Severe dysmenorrhea 2.  Chronic pelvic pain 3.  Family history of endometriosis Procedure: Laparoscopy with peritoneal biopsies and excision and fulguration of endometriosis    Patient is a 41 y.o. G1P1001female scheduled for surgery on 09/21/2017.  She is to undergo laparoscopy with peritoneal biopsies and probable excision and fulguration of endometriosis.  Patient was previously seen in November 2016 and was to have laparoscopy to rule out endometriosis.  However, she never returned for follow-up until now as symptoms have progressively worsened. Dysmenorrhea is severe, requiring 800 mg of ibuprofen 4 times a day along with Tylenol for partial control of symptoms.  Menses are lasting 7 to 8 days with associated clots.  Pain is typically central cramping along with low back pain and a sense that her bottom is falling out.  She does not report deep thrusting dyspareunia. There is family history of endometriosis in mom. Patient reports no significant urologic or GI issues.  OB History    Gravida  1   Para  1   Term  1   Preterm      AB      Living  1     SAB      TAB      Ectopic      Multiple      Live Births  1           Patient's last menstrual period was 08/27/2017 (exact date).    Past Medical History:  Diagnosis Date  . Anemia   . Anxiety   . Depression   . Endometriosis   . Fibromyalgia   . Heart murmur   . Rash    scalp    Past Surgical History:  Procedure Laterality Date  . CHOLECYSTECTOMY      OB History  Gravida Para Term Preterm AB Living  1 1 1     1  SAB TAB Ectopic Multiple Live Births          1    # Outcome Date GA Lbr Len/2nd Weight Sex Delivery Anes PTL Lv  1 Term 2000   8 lb 1.9 oz (3.683 kg) M Vag-Spont   LIV    Social History   Socioeconomic History  . Marital status: Married    Spouse name: Not on file  . Number of children: Not on file  .  Years of education: Not on file  . Highest education level: Not on file  Occupational History  . Not on file  Social Needs  . Financial resource strain: Not on file  . Food insecurity:    Worry: Not on file    Inability: Not on file  . Transportation needs:    Medical: Not on file    Non-medical: Not on file  Tobacco Use  . Smoking status: Former Smoker  . Smokeless tobacco: Current User  Substance and Sexual Activity  . Alcohol use: Yes    Alcohol/week: 0.0 oz    Comment: occasionally  . Drug use: No  . Sexual activity: Not Currently    Birth control/protection: Other-see comments    Comment: vasectomy  Lifestyle  . Physical activity:    Days per week: Not on file    Minutes per session: Not on file  . Stress: Not on file  Relationships  . Social connections:    Talks on phone: Not on file    Gets together: Not on file      Attends religious service: Not on file    Active member of club or organization: Not on file    Attends meetings of clubs or organizations: Not on file    Relationship status: Not on file  Other Topics Concern  . Not on file  Social History Narrative  . Not on file    Family History  Problem Relation Age of Onset  . Depression Mother   . Arthritis Mother   . Hypertension Mother   . Endometriosis Mother   . Heart disease Father   . Deep vein thrombosis Father   . Hypertension Father   . Diabetes Father   . Ovarian cancer Neg Hx   . Breast cancer Neg Hx      (Not in a hospital admission)  No Known Allergies  Review of Systems Constitutional: No recent fever/chills/sweats Respiratory: No recent cough/bronchitis Cardiovascular: No chest pain Gastrointestinal: No recent nausea/vomiting/diarrhea Genitourinary: No UTI symptoms Hematologic/lymphatic:No history of coagulopathy or recent blood thinner use    Objective:    BP 120/79   Pulse 85   Ht 5' 4" (1.626 m)   Wt 159 lb 3.4 oz (72.2 kg)   LMP 08/27/2017 (Exact Date)   BMI 27.33  kg/m   General:   Normal  Skin:   normal  HEENT:  Normal  Neck:  Supple without Adenopathy or Thyromegaly  Lungs:   Heart:              Breasts:   Abdomen:  Pelvis:  M/S   Extremeties:  Neuro:    clear to auscultation bilaterally   Normal without murmur   Not Examined   soft, non-tender; bowel sounds normal; no masses,  no organomegaly   Exam deferred to OR  No CVAT  Warm/Dry   Normal        08/27/2017 PELVIC:             External Genitalia: Normal             BUS: Normal             Vagina: Normal             Cervix: No lesions; stenotic; cervical motion tenderness 3/4             Uterus: Normal size, shape,consistency, mobile, midplane, tender 3/4             Adnexa: Normal; nonpalpable nontender             RV: Normal external exam             Bladder: Nontender    Assessment:     1.  Severe dysmenorrhea 2.  Chronic pelvic pain 3.  Family history of endometriosis   Plan:   Laparoscopy with peritoneal biopsies and probable excision and fulguration of endometriosis  Preop counseling: The patient is to undergo laparoscopy with peritoneal biopsies and probable excision and fulguration of endometriosis on 09/21/2017.  She is understanding of the planned procedures and is aware of and is accepting of all surgical risks which include but are not limited to bleeding, infection, pelvic organ injury with need for repair, blood clot disorders, anesthesia risk, etc.  All questions have been answered.  Informed consent is given.  Patient is very willing to proceed with surgery as scheduled.  Ege A Belton Peplinski, MD  Note: This dictation was prepared with Dragon dictation along with smaller phrase technology. Any transcriptional errors that result from this process are unintentional.       

## 2017-09-15 NOTE — Progress Notes (Signed)
PREOPERATIVE HISTORY AND PHYSICAL  Date of surgery: 09/21/2017 Diagnosis: 1.  Severe dysmenorrhea 2.  Chronic pelvic pain 3.  Family history of endometriosis Procedure: Laparoscopy with peritoneal biopsies and excision and fulguration of endometriosis    Patient is a 42 y.o. G1P1015female scheduled for surgery on 09/21/2017.  She is to undergo laparoscopy with peritoneal biopsies and probable excision and fulguration of endometriosis.  Patient was previously seen in November 2016 and was to have laparoscopy to rule out endometriosis.  However, she never returned for follow-up until now as symptoms have progressively worsened. Dysmenorrhea is severe, requiring 800 mg of ibuprofen 4 times a day along with Tylenol for partial control of symptoms.  Menses are lasting 7 to 8 days with associated clots.  Pain is typically central cramping along with low back pain and a sense that her bottom is falling out.  She does not report deep thrusting dyspareunia. There is family history of endometriosis in mom. Patient reports no significant urologic or GI issues.  OB History    Gravida  1   Para  1   Term  1   Preterm      AB      Living  1     SAB      TAB      Ectopic      Multiple      Live Births  1           Patient's last menstrual period was 08/27/2017 (exact date).    Past Medical History:  Diagnosis Date  . Anemia   . Anxiety   . Depression   . Endometriosis   . Fibromyalgia   . Heart murmur   . Rash    scalp    Past Surgical History:  Procedure Laterality Date  . CHOLECYSTECTOMY      OB History  Gravida Para Term Preterm AB Living  SAB TAB Ectopic Multiple Live Births          1    # Outcome Date GA Lbr Len/2nd Weight Sex Delivery Anes PTL Lv  1 Term 2000   8 lb 1.9 oz (3.683 kg) M Vag-Spont   LIV    Social History   Socioeconomic History  . Marital status: Married    Spouse name: Not on file  . Number of children: Not on file  .  Years of education: Not on file  . Highest education level: Not on file  Occupational History  . Not on file  Social Needs  . Financial resource strain: Not on file  . Food insecurity:    Worry: Not on file    Inability: Not on file  . Transportation needs:    Medical: Not on file    Non-medical: Not on file  Tobacco Use  . Smoking status: Former Games developer  . Smokeless tobacco: Current User  Substance and Sexual Activity  . Alcohol use: Yes    Alcohol/week: 0.0 oz    Comment: occasionally  . Drug use: No  . Sexual activity: Not Currently    Birth control/protection: Other-see comments    Comment: vasectomy  Lifestyle  . Physical activity:    Days per week: Not on file    Minutes per session: Not on file  . Stress: Not on file  Relationships  . Social connections:    Talks on phone: Not on file    Gets together: Not on file  Attends religious service: Not on file    Active member of club or organization: Not on file    Attends meetings of clubs or organizations: Not on file    Relationship status: Not on file  Other Topics Concern  . Not on file  Social History Narrative  . Not on file    Family History  Problem Relation Age of Onset  . Depression Mother   . Arthritis Mother   . Hypertension Mother   . Endometriosis Mother   . Heart disease Father   . Deep vein thrombosis Father   . Hypertension Father   . Diabetes Father   . Ovarian cancer Neg Hx   . Breast cancer Neg Hx      (Not in a hospital admission)  No Known Allergies  Review of Systems Constitutional: No recent fever/chills/sweats Respiratory: No recent cough/bronchitis Cardiovascular: No chest pain Gastrointestinal: No recent nausea/vomiting/diarrhea Genitourinary: No UTI symptoms Hematologic/lymphatic:No history of coagulopathy or recent blood thinner use    Objective:    BP 120/79   Pulse 85   Ht  (1.626 m)   Wt 159 lb 3.4 oz (72.2 kg)   LMP 08/27/2017 (Exact Date)   BMI 27.33  kg/m   General:   Normal  Skin:   normal  HEENT:  Normal  Neck:  Supple without Adenopathy or Thyromegaly  Lungs:   Heart:              Breasts:   Abdomen:  Pelvis:  M/S   Extremeties:  Neuro:    clear to auscultation bilaterally   Normal without murmur   Not Examined   soft, non-tender; bowel sounds normal; no masses,  no organomegaly   Exam deferred to OR  No CVAT  Warm/Dry   Normal        08/27/2017 PELVIC:             External Genitalia: Normal             BUS: Normal             Vagina: Normal             Cervix: No lesions; stenotic; cervical motion tenderness 3/4             Uterus: Normal size, shape,consistency, mobile, midplane, tender 3/4             Adnexa: Normal; nonpalpable nontender             RV: Normal external exam             Bladder: Nontender    Assessment:     1.  Severe dysmenorrhea 2.  Chronic pelvic pain 3.  Family history of endometriosis   Plan:   Laparoscopy with peritoneal biopsies and probable excision and fulguration of endometriosis  Preop counseling: The patient is to undergo laparoscopy with peritoneal biopsies and probable excision and fulguration of endometriosis on 09/21/2017.  She is understanding of the planned procedures and is aware of and is accepting of all surgical risks which include but are not limited to bleeding, infection, pelvic organ injury with need for repair, blood clot disorders, anesthesia risk, etc.  All questions have been answered.  Informed consent is given.  Patient is very willing to proceed with surgery as scheduled.  Herold Harms, MD  Note: This dictation was prepared with Dragon dictation along with smaller phrase technology. Any transcriptional errors that result from this process are unintentional.

## 2017-09-15 NOTE — Patient Instructions (Signed)
0.1.  Preop appointment is completed today 2.  Return in 1 week after surgery for postop check   Diagnostic Laparoscopy, Care After Refer to this sheet in the next few weeks. These instructions provide you with information about caring for yourself after your procedure. Your health care provider may also give you more specific instructions. Your treatment has been planned according to current medical practices, but problems sometimes occur. Call your health care provider if you have any problems or questions after your procedure. What can I expect after the procedure? After your procedure, it is common to have mild discomfort in the throat and abdomen. Follow these instructions at home:  Take over-the-counter and prescription medicines only as told by your health care provider.  Do not drive for 24 hours if you received a sedative.  Return to your normal activities as told by your health care provider.  Do not take baths, swim, or use a hot tub until your health care provider approves. You may shower.  Follow instructions from your health care provider about how to take care of your incision. Make sure you: ? Wash your hands with soap and water before you change your bandage (dressing). If soap and water are not available, use hand sanitizer. ? Change your dressing as told by your health care provider. ? Leave stitches (sutures), skin glue, or adhesive strips in place. These skin closures may need to stay in place for 2 weeks or longer. If adhesive strip edges start to loosen and curl up, you may trim the loose edges. Do not remove adhesive strips completely unless your health care provider tells you to do that.  Check your incision area every day for signs of infection. Check for: ? More redness, swelling, or pain. ? More fluid or blood. ? Warmth. ? Pus or a bad smell.  It is your responsibility to get the results of your procedure. Ask your health care provider or the department  performing the procedure when your results will be ready. Contact a health care provider if:  There is new pain in your shoulders.  You feel light-headed or faint.  You are unable to pass gas or unable to have a bowel movement.  You feel nauseous or you vomit.  You develop a rash.  You have more redness, swelling, or pain around your incision.  You have more fluid or blood coming from your incision.  Your incision feels warm to the touch.  You have pus or a bad smell coming from your incision.  You have a fever or chills. Get help right away if:  Your pain is getting worse.  You have ongoing vomiting.  The edges of your incision open up.  You have trouble breathing.  You have chest pain. This information is not intended to replace advice given to you by your health care provider. Make sure you discuss any questions you have with your health care provider. Document Released: 04/16/2015 Document Revised: 10/11/2015 Document Reviewed: 01/16/2015 Elsevier Interactive Patient Education  2018 [employer].

## 2017-09-15 NOTE — H&P (Signed)
PREOPERATIVE HISTORY AND PHYSICAL  Date of surgery: 09/21/2017 Diagnosis: 1.  Severe dysmenorrhea 2.  Chronic pelvic pain 3.  Family history of endometriosis Procedure: Laparoscopy with peritoneal biopsies and excision and fulguration of endometriosis    Patient is a 41 y.o. G1P1001female scheduled for surgery on 09/21/2017.  She is to undergo laparoscopy with peritoneal biopsies and probable excision and fulguration of endometriosis.  Patient was previously seen in November 2016 and was to have laparoscopy to rule out endometriosis.  However, she never returned for follow-up until now as symptoms have progressively worsened. Dysmenorrhea is severe, requiring 800 mg of ibuprofen 4 times a day along with Tylenol for partial control of symptoms.  Menses are lasting 7 to 8 days with associated clots.  Pain is typically central cramping along with low back pain and a sense that her bottom is falling out.  She does not report deep thrusting dyspareunia. There is family history of endometriosis in mom. Patient reports no significant urologic or GI issues.  OB History    Gravida  1   Para  1   Term  1   Preterm      AB      Living  1     SAB      TAB      Ectopic      Multiple      Live Births  1           Patient's last menstrual period was 08/27/2017 (exact date).    Past Medical History:  Diagnosis Date  . Anemia   . Anxiety   . Depression   . Endometriosis   . Fibromyalgia   . Heart murmur   . Rash    scalp    Past Surgical History:  Procedure Laterality Date  . CHOLECYSTECTOMY      OB History  Gravida Para Term Preterm AB Living  1 1 1     1  SAB TAB Ectopic Multiple Live Births          1    # Outcome Date GA Lbr Len/2nd Weight Sex Delivery Anes PTL Lv  1 Term 2000   8 lb 1.9 oz (3.683 kg) M Vag-Spont   LIV    Social History   Socioeconomic History  . Marital status: Married    Spouse name: Not on file  . Number of children: Not on file  .  Years of education: Not on file  . Highest education level: Not on file  Occupational History  . Not on file  Social Needs  . Financial resource strain: Not on file  . Food insecurity:    Worry: Not on file    Inability: Not on file  . Transportation needs:    Medical: Not on file    Non-medical: Not on file  Tobacco Use  . Smoking status: Former Smoker  . Smokeless tobacco: Current User  Substance and Sexual Activity  . Alcohol use: Yes    Alcohol/week: 0.0 oz    Comment: occasionally  . Drug use: No  . Sexual activity: Not Currently    Birth control/protection: Other-see comments    Comment: vasectomy  Lifestyle  . Physical activity:    Days per week: Not on file    Minutes per session: Not on file  . Stress: Not on file  Relationships  . Social connections:    Talks on phone: Not on file    Gets together: Not on file      Attends religious service: Not on file    Active member of club or organization: Not on file    Attends meetings of clubs or organizations: Not on file    Relationship status: Not on file  Other Topics Concern  . Not on file  Social History Narrative  . Not on file    Family History  Problem Relation Age of Onset  . Depression Mother   . Arthritis Mother   . Hypertension Mother   . Endometriosis Mother   . Heart disease Father   . Deep vein thrombosis Father   . Hypertension Father   . Diabetes Father   . Ovarian cancer Neg Hx   . Breast cancer Neg Hx      (Not in a hospital admission)  No Known Allergies  Review of Systems Constitutional: No recent fever/chills/sweats Respiratory: No recent cough/bronchitis Cardiovascular: No chest pain Gastrointestinal: No recent nausea/vomiting/diarrhea Genitourinary: No UTI symptoms Hematologic/lymphatic:No history of coagulopathy or recent blood thinner use    Objective:    BP 120/79   Pulse 85   Ht 5' 4" (1.626 m)   Wt 159 lb 3.4 oz (72.2 kg)   LMP 08/27/2017 (Exact Date)   BMI 27.33  kg/m   General:   Normal  Skin:   normal  HEENT:  Normal  Neck:  Supple without Adenopathy or Thyromegaly  Lungs:   Heart:              Breasts:   Abdomen:  Pelvis:  M/S   Extremeties:  Neuro:    clear to auscultation bilaterally   Normal without murmur   Not Examined   soft, non-tender; bowel sounds normal; no masses,  no organomegaly   Exam deferred to OR  No CVAT  Warm/Dry   Normal        08/27/2017 PELVIC:             External Genitalia: Normal             BUS: Normal             Vagina: Normal             Cervix: No lesions; stenotic; cervical motion tenderness 3/4             Uterus: Normal size, shape,consistency, mobile, midplane, tender 3/4             Adnexa: Normal; nonpalpable nontender             RV: Normal external exam             Bladder: Nontender    Assessment:     1.  Severe dysmenorrhea 2.  Chronic pelvic pain 3.  Family history of endometriosis   Plan:   Laparoscopy with peritoneal biopsies and probable excision and fulguration of endometriosis  Preop counseling: The patient is to undergo laparoscopy with peritoneal biopsies and probable excision and fulguration of endometriosis on 09/21/2017.  She is understanding of the planned procedures and is aware of and is accepting of all surgical risks which include but are not limited to bleeding, infection, pelvic organ injury with need for repair, blood clot disorders, anesthesia risk, etc.  All questions have been answered.  Informed consent is given.  Patient is very willing to proceed with surgery as scheduled.  Covello A Defrancesco, MD  Note: This dictation was prepared with Dragon dictation along with smaller phrase technology. Any transcriptional errors that result from this process are unintentional.       

## 2017-09-16 ENCOUNTER — Other Ambulatory Visit: Payer: Self-pay

## 2017-09-16 ENCOUNTER — Encounter
Admission: RE | Admit: 2017-09-16 | Discharge: 2017-09-16 | Disposition: A | Discharge status: Home / Self Care | Payer: 59 | Source: Ambulatory Visit | Attending: Obstetrics and Gynecology | Admitting: Obstetrics and Gynecology

## 2017-09-16 HISTORY — DX: Headache: R51

## 2017-09-16 HISTORY — DX: Headache, unspecified: R51.9

## 2017-09-16 HISTORY — DX: Sepsis, unspecified organism: A41.9

## 2017-09-16 HISTORY — DX: Gastro-esophageal reflux disease without esophagitis: K21.9

## 2017-09-16 HISTORY — DX: Unspecified asthma, uncomplicated: J45.909

## 2017-09-16 NOTE — Patient Instructions (Signed)
Your procedure is scheduled on: 09-21-17 MONDAY Report to Same Day Surgery 2nd floor medical mall [facility] Entrance-take elevator on left to 2nd floor.  Check in with surgery information desk.) To find out your arrival time please call [phone] between 1PM - 3PM on 09-18-17 FRIDAY  Remember: Instructions that are not followed completely may result in serious medical risk, up to and including death, or upon the discretion of your surgeon and anesthesiologist your surgery may need to be rescheduled.    _x___ 1. Do not eat food after midnight the night before your procedure. NO GUM OR CANDY AFTER MIDNIGHT.  You may drink clear liquids up to 2 hours before you are scheduled to arrive at the hospital for your procedure.  Do not drink clear liquids within 2 hours of your scheduled arrival to the hospital.  Clear liquids include  --Water or Apple juice without pulp  --Clear carbohydrate beverage such as ClearFast or Gatorade  --Black Coffee or Clear Tea (No milk, no creamers, do not add anything to the coffee or Tea     __x__ 2. No Alcohol for 24 hours before or after surgery.   __x__3. No Smoking or e-cigarettes for 24 prior to surgery.  Do not use any chewable tobacco products for at least 6 hour prior to surgery   ____  4. Bring all medications with you on the day of surgery if instructed.    __x__ 5. Notify your doctor if there is any change in your medical condition     (cold, fever, infections).    x___6. On the morning of surgery brush your teeth with toothpaste and water.  You may rinse your mouth with mouth wash if you wish.  Do not swallow any toothpaste or mouthwash.   Do not wear jewelry, make-up, hairpins, clips or nail polish.  Do not wear lotions, powders, or perfumes. You may wear deodorant.  Do not shave 48 hours prior to surgery. Men may shave face and neck.  Do not bring valuables to the hospital.    [facility] is not responsible for any belongings or  valuables.               Contacts, dentures or bridgework may not be worn into surgery.  Leave your suitcase in the car. After surgery it may be brought to your room.  For patients admitted to the hospital, discharge time is determined by your treatment team.  _  Patients discharged the day of surgery will not be allowed to drive home.  You will need someone to drive you home and stay with you the night of your procedure.    Please read over the following fact sheets that you were given:   [facility] Preparing for Surgery and or MRSA Information   _x___ TAKE THE FOLLOWING MEDICATION THE MORNING OF SURGERY WITH A SMALL SIP OF WATER.  These include:  1. GABAPENTIN   2  PRILOSEC  3. TOPAMAX  4. EFFEXOR  5. YOU MAY TAKE OXYCODONE DAY OF SURGERY IF NEEDED FOR PAIN  6. YOU MAY TAKE YOUR VALIUM DAY OF SURGERY IF NEEDED  ____Fleets enema or Magnesium Citrate as directed.   _x___ Use CHG Soap or sage wipes as directed on instruction sheet   ____ Use inhalers on the day of surgery and bring to hospital day of surgery  ____ Stop Metformin and Janumet 2 days prior to surgery.    ____ Take 1/2 of usual insulin dose the night before  surgery and none on the morning surgery.   ____ Follow recommendations from Cardiologist, Pulmonologist or PCP regarding stopping Aspirin, Coumadin, Plavix ,Eliquis, Effient, or Pradaxa, and Pletal.  X____Stop Anti-inflammatories such as Advil, Aleve, IBUPROFEN, Motrin, Naproxen, Naprosyn, Goodies powders or aspirin products NOW-OK to take Tylenol    ____ Stop supplements until after surgery.     ____ Bring C-Pap to the hospital.

## 2017-09-18 ENCOUNTER — Encounter
Admission: RE | Admit: 2017-09-18 | Discharge: 2017-09-18 | Disposition: A | Discharge status: Home / Self Care | Payer: Commercial Managed Care - HMO | Source: Ambulatory Visit | Attending: Obstetrics and Gynecology | Admitting: Obstetrics and Gynecology

## 2017-09-18 DIAGNOSIS — N803 Endometriosis of pelvic peritoneum: Secondary | ICD-10-CM | POA: Diagnosis not present

## 2017-09-18 DIAGNOSIS — K219 Gastro-esophageal reflux disease without esophagitis: Secondary | ICD-10-CM | POA: Diagnosis not present

## 2017-09-18 DIAGNOSIS — N946 Dysmenorrhea, unspecified: Secondary | ICD-10-CM | POA: Diagnosis not present

## 2017-09-18 DIAGNOSIS — R011 Cardiac murmur, unspecified: Secondary | ICD-10-CM | POA: Diagnosis not present

## 2017-09-18 DIAGNOSIS — Z842 Family history of other diseases of the genitourinary system: Secondary | ICD-10-CM | POA: Diagnosis not present

## 2017-09-18 DIAGNOSIS — R102 Pelvic and perineal pain: Secondary | ICD-10-CM | POA: Diagnosis not present

## 2017-09-18 DIAGNOSIS — M797 Fibromyalgia: Secondary | ICD-10-CM | POA: Diagnosis not present

## 2017-09-18 DIAGNOSIS — F419 Anxiety disorder, unspecified: Secondary | ICD-10-CM | POA: Diagnosis not present

## 2017-09-18 DIAGNOSIS — F329 Major depressive disorder, single episode, unspecified: Secondary | ICD-10-CM | POA: Diagnosis not present

## 2017-09-18 DIAGNOSIS — G8929 Other chronic pain: Secondary | ICD-10-CM | POA: Diagnosis not present

## 2017-09-18 DIAGNOSIS — J45909 Unspecified asthma, uncomplicated: Secondary | ICD-10-CM | POA: Diagnosis not present

## 2017-09-18 DIAGNOSIS — Z8249 Family history of ischemic heart disease and other diseases of the circulatory system: Secondary | ICD-10-CM | POA: Diagnosis not present

## 2017-09-18 DIAGNOSIS — N920 Excessive and frequent menstruation with regular cycle: Secondary | ICD-10-CM | POA: Diagnosis not present

## 2017-09-18 DIAGNOSIS — Z87891 Personal history of nicotine dependence: Secondary | ICD-10-CM | POA: Diagnosis not present

## 2017-09-18 LAB — CBC WITH DIFFERENTIAL/PLATELET
BASOS PCT: 1 %
Basophils Absolute: 0 10*3/uL (ref 0–0.1)
EOS ABS: 0.2 10*3/uL (ref 0–0.7)
Eosinophils Relative: 5 %
HCT: 35.8 % (ref 35.0–47.0)
HEMOGLOBIN: 11.9 g/dL — AB (ref 12.0–16.0)
Lymphocytes Relative: 35 %
Lymphs Abs: 1.8 10*3/uL (ref 1.0–3.6)
MCH: 28.6 pg (ref 26.0–34.0)
MCHC: 33.2 g/dL (ref 32.0–36.0)
MCV: 86.1 fL (ref 80.0–100.0)
Monocytes Absolute: 0.6 10*3/uL (ref 0.2–0.9)
Monocytes Relative: 12 %
NEUTROS PCT: 47 %
Neutro Abs: 2.5 10*3/uL (ref 1.4–6.5)
PLATELETS: 326 10*3/uL (ref 150–440)
RBC: 4.16 MIL/uL (ref 3.80–5.20)
RDW: 19.2 % — ABNORMAL HIGH (ref 11.5–14.5)
WBC: 5.2 10*3/uL (ref 3.6–11.0)

## 2017-09-18 LAB — TYPE AND SCREEN
ABO/RH(D): A POS
ANTIBODY SCREEN: NEGATIVE

## 2017-09-18 LAB — RAPID HIV SCREEN (HIV 1/2 AB+AG)
HIV 1/2 ANTIBODIES: NONREACTIVE
HIV-1 P24 ANTIGEN - HIV24: NONREACTIVE

## 2017-09-19 LAB — RPR: RPR: NONREACTIVE

## 2017-09-21 ENCOUNTER — Ambulatory Visit: Payer: Commercial Managed Care - HMO | Admitting: Anesthesiology

## 2017-09-21 ENCOUNTER — Encounter: Payer: Self-pay | Admitting: *Deleted

## 2017-09-21 ENCOUNTER — Other Ambulatory Visit: Payer: Self-pay

## 2017-09-21 ENCOUNTER — Encounter
Admission: RE | Disposition: A | Discharge status: Home / Self Care | Payer: Self-pay | Source: Ambulatory Visit | Attending: Obstetrics and Gynecology

## 2017-09-21 ENCOUNTER — Ambulatory Visit
Admission: RE | Admit: 2017-09-21 | Discharge: 2017-09-21 | Disposition: A | Discharge status: Home / Self Care | Payer: Commercial Managed Care - HMO | Source: Ambulatory Visit | Attending: Obstetrics and Gynecology | Admitting: Obstetrics and Gynecology

## 2017-09-21 DIAGNOSIS — N920 Excessive and frequent menstruation with regular cycle: Secondary | ICD-10-CM | POA: Diagnosis not present

## 2017-09-21 DIAGNOSIS — F329 Major depressive disorder, single episode, unspecified: Secondary | ICD-10-CM | POA: Insufficient documentation

## 2017-09-21 DIAGNOSIS — N803 Endometriosis of pelvic peritoneum: Secondary | ICD-10-CM | POA: Insufficient documentation

## 2017-09-21 DIAGNOSIS — K660 Peritoneal adhesions (postprocedural) (postinfection): Secondary | ICD-10-CM | POA: Diagnosis not present

## 2017-09-21 DIAGNOSIS — N809 Endometriosis, unspecified: Secondary | ICD-10-CM | POA: Diagnosis not present

## 2017-09-21 DIAGNOSIS — R102 Pelvic and perineal pain: Secondary | ICD-10-CM | POA: Insufficient documentation

## 2017-09-21 DIAGNOSIS — G8929 Other chronic pain: Secondary | ICD-10-CM | POA: Insufficient documentation

## 2017-09-21 DIAGNOSIS — K219 Gastro-esophageal reflux disease without esophagitis: Secondary | ICD-10-CM | POA: Insufficient documentation

## 2017-09-21 DIAGNOSIS — F419 Anxiety disorder, unspecified: Secondary | ICD-10-CM | POA: Insufficient documentation

## 2017-09-21 DIAGNOSIS — N946 Dysmenorrhea, unspecified: Secondary | ICD-10-CM | POA: Insufficient documentation

## 2017-09-21 DIAGNOSIS — R011 Cardiac murmur, unspecified: Secondary | ICD-10-CM | POA: Insufficient documentation

## 2017-09-21 DIAGNOSIS — Z842 Family history of other diseases of the genitourinary system: Secondary | ICD-10-CM | POA: Insufficient documentation

## 2017-09-21 DIAGNOSIS — J45909 Unspecified asthma, uncomplicated: Secondary | ICD-10-CM | POA: Insufficient documentation

## 2017-09-21 DIAGNOSIS — Z8249 Family history of ischemic heart disease and other diseases of the circulatory system: Secondary | ICD-10-CM | POA: Insufficient documentation

## 2017-09-21 DIAGNOSIS — Z87891 Personal history of nicotine dependence: Secondary | ICD-10-CM | POA: Insufficient documentation

## 2017-09-21 DIAGNOSIS — M797 Fibromyalgia: Secondary | ICD-10-CM | POA: Insufficient documentation

## 2017-09-21 HISTORY — PX: LAPAROSCOPY: SHX197

## 2017-09-21 LAB — ABO/RH: ABO/RH(D): A POS

## 2017-09-21 LAB — POCT PREGNANCY, URINE: PREG TEST UR: NEGATIVE

## 2017-09-21 SURGERY — LAPAROSCOPY, DIAGNOSTIC
Anesthesia: General

## 2017-09-21 SURGERY — LAPAROSCOPY, DIAGNOSTIC
Anesthesia: General | Site: Abdomen | Wound class: Clean

## 2017-09-21 MED ORDER — KETOROLAC TROMETHAMINE 30 MG/ML IJ SOLN
INTRAMUSCULAR | Status: AC
  Filled 2017-09-21: qty 1

## 2017-09-21 MED ORDER — GLYCOPYRROLATE 0.2 MG/ML IJ SOLN
INTRAMUSCULAR | Status: AC
Start: 2017-09-21 — End: ?
  Filled 2017-09-21: qty 1

## 2017-09-21 MED ORDER — PROPOFOL 10 MG/ML IV BOLUS
INTRAVENOUS | Status: AC
  Filled 2017-09-21: qty 40

## 2017-09-21 MED ORDER — BUPIVACAINE HCL (PF) 0.25 % IJ SOLN
INTRAMUSCULAR | Status: AC
  Filled 2017-09-21: qty 30

## 2017-09-21 MED ORDER — ROCURONIUM BROMIDE 100 MG/10ML IV SOLN
INTRAVENOUS | Status: DC | PRN
  Administered 2017-09-21: 50 mg via INTRAVENOUS

## 2017-09-21 MED ORDER — PROMETHAZINE HCL 25 MG/ML IJ SOLN: 6.2500 mg | INTRAMUSCULAR | Status: DC | PRN

## 2017-09-21 MED ORDER — PROPOFOL 10 MG/ML IV BOLUS
INTRAVENOUS | Status: DC | PRN
  Administered 2017-09-21: 150 mg via INTRAVENOUS

## 2017-09-21 MED ORDER — PHENYLEPHRINE HCL 10 MG/ML IJ SOLN
INTRAMUSCULAR | Status: DC | PRN
Start: 2017-09-21 — End: 2017-09-21
  Administered 2017-09-21: 100 ug via INTRAVENOUS

## 2017-09-21 MED ORDER — MIDAZOLAM HCL 2 MG/2ML IJ SOLN
INTRAMUSCULAR | Status: AC
  Filled 2017-09-21: qty 2

## 2017-09-21 MED ORDER — ONDANSETRON HCL 4 MG/2ML IJ SOLN
INTRAMUSCULAR | Status: AC
  Filled 2017-09-21: qty 2

## 2017-09-21 MED ORDER — DEXAMETHASONE SODIUM PHOSPHATE 10 MG/ML IJ SOLN
INTRAMUSCULAR | Status: AC
  Filled 2017-09-21: qty 1

## 2017-09-21 MED ORDER — KETOROLAC TROMETHAMINE 30 MG/ML IJ SOLN
INTRAMUSCULAR | Status: DC | PRN
  Administered 2017-09-21: 30 mg via INTRAVENOUS

## 2017-09-21 MED ORDER — SUCCINYLCHOLINE CHLORIDE 20 MG/ML IJ SOLN
INTRAMUSCULAR | Status: AC
Start: 2017-09-21 — End: ?
  Filled 2017-09-21: qty 1

## 2017-09-21 MED ORDER — LACTATED RINGERS IV SOLN
INTRAVENOUS | Status: DC
  Administered 2017-09-21: 08:00:00 via INTRAVENOUS

## 2017-09-21 MED ORDER — FENTANYL CITRATE (PF) 100 MCG/2ML IJ SOLN
INTRAMUSCULAR | Status: DC | PRN
  Administered 2017-09-21 (×2): 50 ug via INTRAVENOUS

## 2017-09-21 MED ORDER — ACETAMINOPHEN NICU IV SYRINGE 10 MG/ML
INTRAVENOUS | Status: AC
  Filled 2017-09-21: qty 1

## 2017-09-21 MED ORDER — OXYCODONE-ACETAMINOPHEN 5-325 MG PO TABS
ORAL_TABLET | ORAL | Status: AC
  Filled 2017-09-21: qty 1

## 2017-09-21 MED ORDER — EPHEDRINE SULFATE 50 MG/ML IJ SOLN
INTRAMUSCULAR | Status: AC
Start: 2017-09-21 — End: ?
  Filled 2017-09-21: qty 1

## 2017-09-21 MED ORDER — OXYCODONE-ACETAMINOPHEN 5-325 MG PO TABS
1.0000 | ORAL_TABLET | ORAL | Status: DC | PRN
  Administered 2017-09-21: 1 via ORAL

## 2017-09-21 MED ORDER — ROCURONIUM BROMIDE 50 MG/5ML IV SOLN
INTRAVENOUS | Status: AC
  Filled 2017-09-21: qty 1

## 2017-09-21 MED ORDER — DEXAMETHASONE SODIUM PHOSPHATE 10 MG/ML IJ SOLN
INTRAMUSCULAR | Status: DC | PRN
Start: 2017-09-21 — End: 2017-09-21
  Administered 2017-09-21: 10 mg via INTRAVENOUS

## 2017-09-21 MED ORDER — LACTATED RINGERS IV SOLN
INTRAVENOUS | Status: DC
  Administered 2017-09-21: 07:00:00 via INTRAVENOUS

## 2017-09-21 MED ORDER — FENTANYL CITRATE (PF) 100 MCG/2ML IJ SOLN
INTRAMUSCULAR | Status: AC
  Administered 2017-09-21: 50 ug via INTRAVENOUS
  Filled 2017-09-21: qty 2

## 2017-09-21 MED ORDER — LIDOCAINE HCL (PF) 2 % IJ SOLN
INTRAMUSCULAR | Status: AC
  Filled 2017-09-21: qty 10

## 2017-09-21 MED ORDER — IBUPROFEN 600 MG PO TABS
600.0000 mg | ORAL_TABLET | Freq: Four times a day (QID) | ORAL | Status: DC
  Filled 2017-09-21: qty 1

## 2017-09-21 MED ORDER — BUPIVACAINE-EPINEPHRINE (PF) 0.25% -1:200000 IJ SOLN
INTRAMUSCULAR | Status: AC
  Filled 2017-09-21: qty 30

## 2017-09-21 MED ORDER — LIDOCAINE HCL (CARDIAC) PF 100 MG/5ML IV SOSY
PREFILLED_SYRINGE | INTRAVENOUS | Status: DC | PRN
  Administered 2017-09-21: 100 mg via INTRAVENOUS

## 2017-09-21 MED ORDER — MIDAZOLAM HCL 2 MG/2ML IJ SOLN
INTRAMUSCULAR | Status: DC | PRN
  Administered 2017-09-21: 2 mg via INTRAVENOUS

## 2017-09-21 MED ORDER — PHENYLEPHRINE HCL 10 MG/ML IJ SOLN
INTRAMUSCULAR | Status: AC
  Filled 2017-09-21: qty 1

## 2017-09-21 MED ORDER — ONDANSETRON HCL 4 MG/2ML IJ SOLN
INTRAMUSCULAR | Status: DC | PRN
  Administered 2017-09-21: 4 mg via INTRAVENOUS

## 2017-09-21 MED ORDER — FENTANYL CITRATE (PF) 100 MCG/2ML IJ SOLN
INTRAMUSCULAR | Status: AC
  Filled 2017-09-21: qty 2

## 2017-09-21 MED ORDER — SUGAMMADEX SODIUM 500 MG/5ML IV SOLN
INTRAVENOUS | Status: DC | PRN
  Administered 2017-09-21: 300 mg via INTRAVENOUS

## 2017-09-21 MED ORDER — ACETAMINOPHEN 10 MG/ML IV SOLN
INTRAVENOUS | Status: DC | PRN
  Administered 2017-09-21: 1000 mg via INTRAVENOUS

## 2017-09-21 MED ORDER — BUPIVACAINE-EPINEPHRINE (PF) 0.5% -1:200000 IJ SOLN
INTRAMUSCULAR | Status: AC
  Filled 2017-09-21: qty 30

## 2017-09-21 MED ORDER — FENTANYL CITRATE (PF) 100 MCG/2ML IJ SOLN
25.0000 ug | INTRAMUSCULAR | Status: DC | PRN
  Administered 2017-09-21 (×2): 50 ug via INTRAVENOUS

## 2017-09-21 SURGICAL SUPPLY — 44 items
ADH SKN CLS APL DERMABOND .7 (GAUZE/BANDAGES/DRESSINGS) ×1
ANCHOR TIS RET SYS 235ML (MISCELLANEOUS) IMPLANT
BAG TISS RTRVL C235 10X14 (MISCELLANEOUS)
BLADE SURG SZ11 CARB STEEL (BLADE) ×3 IMPLANT
CANISTER SUCT 1200ML W/VALVE (MISCELLANEOUS) ×3 IMPLANT
CATH ROBINSON RED A/P 16FR (CATHETERS) ×3 IMPLANT
CHLORAPREP W/TINT 26ML (MISCELLANEOUS) ×3 IMPLANT
DERMABOND ADVANCED (GAUZE/BANDAGES/DRESSINGS) ×2
DERMABOND ADVANCED .7 DNX12 (GAUZE/BANDAGES/DRESSINGS) ×1 IMPLANT
DRSG TEGADERM 2-3/8X2-3/4 SM (GAUZE/BANDAGES/DRESSINGS) ×9 IMPLANT
DRSG TEGADERM 4X4.75 (GAUZE/BANDAGES/DRESSINGS) ×9 IMPLANT
DRSG TELFA 3X8 NADH (GAUZE/BANDAGES/DRESSINGS) ×3 IMPLANT
DRSG TELFA 4X3 1S NADH ST (GAUZE/BANDAGES/DRESSINGS) ×9 IMPLANT
GLOVE BIO SURGEON STRL SZ8 (GLOVE) ×3 IMPLANT
GLOVE INDICATOR 8.0 STRL GRN (GLOVE) ×3 IMPLANT
GOWN STRL REUS W/ TWL LRG LVL3 (GOWN DISPOSABLE) ×1 IMPLANT
GOWN STRL REUS W/ TWL XL LVL3 (GOWN DISPOSABLE) ×1 IMPLANT
GOWN STRL REUS W/TWL LRG LVL3 (GOWN DISPOSABLE) ×3
GOWN STRL REUS W/TWL XL LVL3 (GOWN DISPOSABLE) ×3
IRRIGATION STRYKERFLOW (MISCELLANEOUS) IMPLANT
IRRIGATOR STRYKERFLOW (MISCELLANEOUS)
IV LACTATED RINGERS 1000ML (IV SOLUTION) ×3 IMPLANT
IV NS 1000ML (IV SOLUTION) ×3
IV NS 1000ML BAXH (IV SOLUTION) ×1 IMPLANT
KIT PINK PAD W/HEAD ARE REST (MISCELLANEOUS) ×3
KIT PINK PAD W/HEAD ARM REST (MISCELLANEOUS) ×1 IMPLANT
KIT TURNOVER CYSTO (KITS) ×3 IMPLANT
LABEL OR SOLS (LABEL) IMPLANT
NS IRRIG 500ML POUR BTL (IV SOLUTION) ×3 IMPLANT
PACK GYN LAPAROSCOPIC (MISCELLANEOUS) ×3 IMPLANT
PAD DRESSING TELFA 3X8 NADH (GAUZE/BANDAGES/DRESSINGS) ×1 IMPLANT
PAD OB MATERNITY 4.3X12.25 (PERSONAL CARE ITEMS) ×3 IMPLANT
PAD PREP 24X41 OB/GYN DISP (PERSONAL CARE ITEMS) ×3 IMPLANT
SCISSORS METZENBAUM CVD 33 (INSTRUMENTS) IMPLANT
SHEARS HARMONIC ACE PLUS 36CM (ENDOMECHANICALS) IMPLANT
SLEEVE ENDOPATH XCEL 5M (ENDOMECHANICALS) ×3 IMPLANT
SUT MNCRL 4-0 (SUTURE) ×3
SUT MNCRL 4-0 27XMFL (SUTURE) ×1
SUT VIC AB 0 UR5 27 (SUTURE) ×3 IMPLANT
SUTURE MNCRL 4-0 27XMF (SUTURE) ×1 IMPLANT
SYR 30ML LL (SYRINGE) ×3 IMPLANT
TROCAR ENDO BLADELESS 11MM (ENDOMECHANICALS) IMPLANT
TROCAR XCEL NON-BLD 5MMX100MML (ENDOMECHANICALS) ×3 IMPLANT
TUBING INSUFFLATION (TUBING) ×3 IMPLANT

## 2017-09-21 NOTE — Anesthesia Postprocedure Evaluation (Signed)
Anesthesia Post Note  Patient: Isabel Robinson  Procedure(s) Performed: LAPAROSCOPY DIAGNOSTIC WITH BIOPSIES (N/A Abdomen)  Patient location during evaluation: PACU Anesthesia Type: General Level of consciousness: awake and alert Pain management: pain level controlled Vital Signs Assessment: post-procedure vital signs reviewed and stable Respiratory status: spontaneous breathing, nonlabored ventilation, respiratory function stable and patient connected to nasal cannula oxygen Cardiovascular status: blood pressure returned to baseline and stable Postop Assessment: no apparent nausea or vomiting Anesthetic complications: no     Last Vitals:  Vitals:   09/21/17 0936 09/21/17 0949  BP: 132/80 124/76  Pulse: 96   Resp: 18   Temp: 36.7 C   SpO2: 95% 99%    Last Pain:  Vitals:   09/21/17 1002  TempSrc:   PainSc: 6                  Lenard Simmer

## 2017-09-21 NOTE — OR Nursing (Signed)
Discussed discharge instuctions with pt and husband. Both voice understanding.

## 2017-09-21 NOTE — Anesthesia Procedure Notes (Signed)
Procedure Name: Intubation Date/Time: 09/21/2017 7:45 AM Performed by: Doreen Salvage, CRNA Pre-anesthesia Checklist: Patient identified, Patient being monitored, Timeout performed, Emergency Drugs available and Suction available Patient Re-evaluated:Patient Re-evaluated prior to induction Oxygen Delivery Method: Circle system utilized Preoxygenation: Pre-oxygenation with 100% oxygen Induction Type: IV induction Ventilation: Mask ventilation without difficulty Laryngoscope Size: Mac and 3 Grade View: Grade I Tube type: Oral Tube size: 7.0 mm Number of attempts: 1 Airway Equipment and Method: Stylet Placement Confirmation: ETT inserted through vocal cords under direct vision,  positive ETCO2 and breath sounds checked- equal and bilateral Secured at: 21 cm Tube secured with: Tape Dental Injury: Teeth and Oropharynx as per pre-operative assessment

## 2017-09-21 NOTE — Transfer of Care (Signed)
Immediate Anesthesia Transfer of Care Note  Patient: Isabel Robinson  Procedure(s) Performed: Procedure(s): LAPAROSCOPY DIAGNOSTIC WITH BIOPSIES (N/A)  Patient Location: PACU  Anesthesia Type:General  Level of Consciousness: sedated  Airway & Oxygen Therapy: Patient Spontanous Breathing and Patient connected to face mask oxygen  Post-op Assessment: Report given to RN and Post -op Vital signs reviewed and stable  Post vital signs: Reviewed and stable  Last Vitals:  Vitals:   09/21/17 0638 09/21/17 0852  BP: 125/70 125/71  Pulse: 95 (!) 114  Resp: 18 14  Temp: 37 C 36.9 C  SpO2: 98% 100%    Complications: No apparent anesthesia complications

## 2017-09-21 NOTE — Op Note (Signed)
Isabel Robinson PROCEDURE DATE: 09/21/2017 8:39 AM  PREOPERATIVE DIAGNOSIS: DYSMENORRHEA, MENORRHAGIA, PELVIC PAIN, F/H OF ENDOMETRIOSIS, CERVICAL STENOSIS, H/O CRYOSURGERY POSTOPERATIVE DIAGNOSIS: Dysmenorrhea, Endometriosis, FH Endometriosis PROCEDURE: Procedure(s): LAPAROSCOPY DIAGNOSTIC WITH BIOPSIES (N/A) and Excision and fulguration of endometriosis SURGEON:  Dr. Daphine Deutscher A Taylormarie Register ASSISTANT: Brennan Bailey, MD ANESTHESIA: General Endotracheal  INDICATIONS: 42 y.o. G1P1001 with history of chronic pelvic pain concerning for endometriosis desiring surgical evaluation.   Please see preoperative notes for further details.   FINDINGS:  Small uterus, normal ovaries and fallopian tubes bilaterally.  Red lesion of endometriosis noted in the left round ligament; pseudo-fenestration and white lesion in the left ovarian fossa consistent with endometriosis; white lesions in the cul-de-sac consistent with endometriosis; white lesion with scarring in the right ovarian fossa consistent with endometriosis.  Bladder flap was clear.  Upper abdomen was normal.  Appendix was normal.   I/O's: No intake/output data recorded. SPECIMENS: Peritoneal biopsies (left round ligament; left ovarian fossa; cul-de-sac; right ovarian fossa) COMPLICATIONS: None immediate COUNTS:  YES  PROCEDURE IN DETAIL: The patient was brought to the operating room where she was placed in the supine position.  General endotracheal anesthesia was induced without difficulty.  She was placed in the dorsal lithotomy position using bumblebee stirrups.  A ChloraPrep and Betadine, abdominal, perineal, intravaginal prep and drape was performed in standard fashion.  The timeout was completed.  The red Robison catheter was used to drain the bladder of 15 mL clear urine.  A weighted speculum was placed into the vagina and a Hulka tenaculum was placed onto the cervix to facilitate uterine manipulation.  Pelvis is gynecoid.  Patient is a candidate for  laparoscopic-assisted vaginal hysterectomy. Laparoscopy was performed in standard fashion.  The Optiview 5 mm trocar and sleeve were placed through a 5 mm subumbilical incision directly into the abdominal pelvic cavity, without evidence of bowel or vascular injury.  1 additional 5 mm port was placed in the lower quadrants, respectively, under direct visualization.  The above noted findings were photo documented. Peritoneal biopsies were taken with biopsy forceps as noted.  The biopsy sites were cauterized using Kleppinger bipolar forceps.  The pelvis was irrigated with normal saline and the irrigant fluid was aspirated. Upon completion of the procedures and inspection for hemostasis, the surgery was complete with all instrumentation being removed from the abdominal pelvic cavity.  The pneumoperitoneum was released.  The incisions were closed with 4-0 Vicryl suture in a simple manner.  Dermabond glue was placed over the incisions.  The patient was awakened, extubated, and taken to the recovery room in satisfactory condition.  Herold Harms, MD [facility]

## 2017-09-21 NOTE — Discharge Instructions (Signed)

## 2017-09-21 NOTE — Anesthesia Preprocedure Evaluation (Signed)
Anesthesia Evaluation  Patient identified by MRN, date of birth, ID band Patient awake    Reviewed: Allergy & Precautions, H&P , NPO status , Patient's Chart, lab work & pertinent test results, reviewed documented beta blocker date and time   Airway Mallampati: III  TM Distance: >3 FB Neck ROM: full    Dental  (+) Partial Upper, Dental Advidsory Given, Missing   Pulmonary neg shortness of breath, asthma , neg sleep apnea, neg COPD, neg recent URI, former smoker,           Cardiovascular Exercise Tolerance: Good (-) hypertension(-) angina(-) CAD, (-) Past MI, (-) Cardiac Stents and (-) CABG (-) dysrhythmias + Valvular Problems/Murmurs      Neuro/Psych  Headaches, neg Seizures PSYCHIATRIC DISORDERS Anxiety Depression  Neuromuscular disease (fibromyalgia)    GI/Hepatic Neg liver ROS, GERD  ,  Endo/Other  negative endocrine ROS  Renal/GU negative Renal ROS  negative genitourinary   Musculoskeletal   Abdominal   Peds  Hematology  (+) Blood dyscrasia, anemia ,   Anesthesia Other Findings Past Medical History: No date: Anemia No date: Anxiety No date: Asthma     Comment:  CHILDHOOD ASTHMA No date: Depression No date: Endometriosis No date: Fibromyalgia No date: GERD (gastroesophageal reflux disease) No date: Headache     Comment:  MIGRAINES No date: Heart murmur     Comment:  ASYMPTOMATIC No date: Rash     Comment:  scalp 2017: Sepsis (HCC)   Reproductive/Obstetrics negative OB ROS                             Anesthesia Physical Anesthesia Plan  ASA: II  Anesthesia Plan: General   Post-op Pain Management:    Induction: Intravenous  PONV Risk Score and Plan: 3 and Ondansetron and Dexamethasone  Airway Management Planned: Oral ETT  Additional Equipment:   Intra-op Plan:   Post-operative Plan: Extubation in OR  Informed Consent: I have reviewed the patients History and  Physical, chart, labs and discussed the procedure including the risks, benefits and alternatives for the proposed anesthesia with the patient or authorized representative who has indicated his/her understanding and acceptance.   Dental Advisory Given  Plan Discussed with: Anesthesiologist, CRNA and Surgeon  Anesthesia Plan Comments:         Anesthesia Quick Evaluation

## 2017-09-21 NOTE — Anesthesia Post-op Follow-up Note (Signed)
Anesthesia QCDR form completed.        

## 2017-09-21 NOTE — Interval H&P Note (Signed)
History and Physical Interval Note:  09/21/2017 7:26 AM  Isabel Robinson  has presented today for surgery, with the diagnosis of DYSMENORRHEA, MENORRHAGIA, PELVIC PAIN, F/G OF ENDOMETRIOSIS, CERVICAL STENOSIS, H/O CRYOSURGERY  The various methods of treatment have been discussed with the patient and family. After consideration of risks, benefits and other options for treatment, the patient has consented to  Procedure(s): LAPAROSCOPY DIAGNOSTIC WITH BIOPSIES (N/A) as a surgical intervention .  The patient's history has been reviewed, patient examined, no change in status, stable for surgery.  I have reviewed the patient's chart and labs.  Questions were answered to the patient's satisfaction.     Daphine Deutscher A Tobe Kervin

## 2017-09-22 ENCOUNTER — Encounter: Payer: 59 | Admitting: Obstetrics and Gynecology

## 2017-09-22 ENCOUNTER — Telehealth: Payer: Self-pay | Admitting: Obstetrics and Gynecology

## 2017-09-22 LAB — SURGICAL PATHOLOGY

## 2017-09-22 NOTE — Telephone Encounter (Signed)
The patient called and stated that she would like to speak with Darol Destine or Dr. Tommi Rumps in regards to her needing a script for pain medication post surgery. The patient stated that she needs to give Dr. Tommi Rumps her pain clinics information.   Dr. Ewing Schlein [phone].  Please advise.

## 2017-09-23 NOTE — Telephone Encounter (Signed)
Per Mad- he prefers her pain dr to rx additional pain meds. Per mad pt will need pain meds for 3 days after surgery only. Spoke with Dr. Metta Clines he is aware mad usually gives s/p lap pts percocet 5/325 1-2 q6 #20. Rarely he will give another 15 at 1 week postop.  Dr. Metta Clines is happy to give pt the percocet but states if pt is still in pain x 1 week she will need to f/u with mad. He is aware pt has a post io 10/01/17. Pt aware of details above.

## 2017-10-01 ENCOUNTER — Ambulatory Visit (INDEPENDENT_AMBULATORY_CARE_PROVIDER_SITE_OTHER): Payer: 59 | Admitting: Obstetrics and Gynecology

## 2017-10-01 ENCOUNTER — Encounter: Payer: Self-pay | Admitting: Obstetrics and Gynecology

## 2017-10-01 VITALS — BP 111/76 | HR 96 | Ht 64.0 in | Wt 160.6 lb

## 2017-10-01 DIAGNOSIS — Z842 Family history of other diseases of the genitourinary system: Secondary | ICD-10-CM

## 2017-10-01 DIAGNOSIS — R102 Pelvic and perineal pain: Secondary | ICD-10-CM

## 2017-10-01 DIAGNOSIS — N920 Excessive and frequent menstruation with regular cycle: Secondary | ICD-10-CM

## 2017-10-01 DIAGNOSIS — N809 Endometriosis, unspecified: Secondary | ICD-10-CM

## 2017-10-01 NOTE — Progress Notes (Signed)
Chief complaint: 1.  Postop check 2.  Status post laparoscopy with peritoneal biopsies and excision and fulguration of endometriosis  Patient presents for postop check.  Laparoscopy with biopsies and excision and fulguration of endometriosis was performed on 09/21/2017.  Pathology from surgery:  Surgical Pathology  CASE: [id number]  PATIENT: Isabel Robinson  Surgical Pathology Report      SPECIMEN SUBMITTED:  A. Round ligament,left  B. Ovarian fossa,left  C. Cul de sac  D. Ovarian fossa, right   CLINICAL HISTORY:  None provided   PRE-OPERATIVE DIAGNOSIS:  Dysmenorrhea, menorrhagia, pelvic pain, F/G of endometriosis, cervical  stenosis, H/O cryosurgery   POST-OPERATIVE DIAGNOSIS:  None provided.      DIAGNOSIS:  A. ROUND LIGAMENT, LEFT; BIOPSY:  - FOCAL FEATURES SUGGESTIVE OF ENDOMETRIOSIS.   B. LEFT OVARIAN FOSSA; BIOPSY:  - MESOTHELIAL LINED FIBROADIPOSE TISSUE, CONSISTENT WITH FIBROUS  ADHESION.   C. CUL-DE-SAC; BIOPSY:  - MESOTHELIAL LINED FIBROADIPOSE TISSUE, CONSISTENT WITH FIBROUS  ADHESION.   D. RIGHT OVARIAN FOSSA; BIOPSY:  - MESOTHELIAL LINED FIBROADIPOSE TISSUE, CONSISTENT WITH FIBROUS  ADHESION.        Findings from surgery: FINDINGS:  Small uterus, normal ovaries and fallopian tubes bilaterally.  Red lesion of endometriosis noted in the left round ligament; pseudo-fenestration and white lesion in the left ovarian fossa consistent with endometriosis; white lesions in the cul-de-sac consistent with endometriosis; white lesion with scarring in the right ovarian fossa consistent with endometriosis.  Bladder flap was clear.  Upper abdomen was normal.  Appendix was normal.  Patient reports bowel and bladder function to be normal.  She is not having any significant postoperative pain or complaints at this time.  OBJECTIVE: BP 111/76   Pulse 96   Ht  (1.626 m)   Wt 160 lb 9.6 oz (72.8 kg)   LMP 08/27/2017 (Exact Date)   BMI 27.57 kg/m   Pleasant female no acute distress.  Alert and oriented. Abdomen: Soft, nontender; laparoscopy port sites are well approximated and healing without evidence of hernia. Pelvic exam: Deferred  ASSESSMENT: 1.  Symptomatic endometriosis, confirmed by biopsy at laparoscopy  PLAN: 1.  Resume activities as tolerated 2.  Monitor endometriosis symptoms over the next 3 months 3.  Return in 3 months for follow-up and further management planning 4.  Alternative medical management options for endometriosis were reviewed including Mirena IUD, danazol, Depo-Lupron therapy.  Patient does not desire Depo-Provera due to history of significant weight gain in the past.  Patient is not an OCP candidate because of smoking history. 5.  Surgical management of endometriosis was reviewed-patient is an [facility] candidate.  Herold Harms, MD  Note: This dictation was prepared with Dragon dictation along with smaller phrase technology. Any transcriptional errors that result from this process are unintentional.

## 2017-10-01 NOTE — Patient Instructions (Signed)
1.  Resume activities as tolerated 2.  Continue with nonsteroidal anti-inflammatory medication and chronic narcotic therapy for chronic pain as prescribed by Dr. Metta Clines 3.  Monitor symptoms of endometriosis over the next 3 months 4.  Return in 3 months for follow-up and consideration of other therapies for endometriosis 5.  Return sooner if desired if definitive surgery is to be considered or if alternative hormonal therapies are desired.  (MIRENA) levonorgestrel intrauterine device (IUD) What is this medicine? LEVONORGESTREL IUD (LEE voe nor jes trel) is a contraceptive (birth control) device. The device is placed inside the uterus by a healthcare professional. It is used to prevent pregnancy. This device can also be used to treat heavy bleeding that occurs during your period. This medicine may be used for other purposes; ask your health care provider or pharmacist if you have questions. COMMON BRAND NAME(S): Cameron Ali What should I tell my health care provider before I take this medicine? They need to know if you have any of these conditions: -abnormal Pap smear -cancer of the breast, uterus, or cervix -diabetes -endometritis -genital or pelvic infection now or in the past -have more than one sexual partner or your partner has more than one partner -heart disease -history of an ectopic or tubal pregnancy -immune system problems -IUD in place -liver disease or tumor -problems with blood clots or take blood-thinners -seizures -use intravenous drugs -uterus of unusual shape -vaginal bleeding that has not been explained -an unusual or allergic reaction to levonorgestrel, other hormones, silicone, or polyethylene, medicines, foods, dyes, or preservatives -pregnant or trying to get pregnant -breast-feeding How should I use this medicine? This device is placed inside the uterus by a health care professional. Talk to your pediatrician regarding the use of this  medicine in children. Special care may be needed. Overdosage: If you think you have taken too much of this medicine contact a poison control center or emergency room at once. NOTE: This medicine is only for you. Do not share this medicine with others. What if I miss a dose? This does not apply. Depending on the brand of device you have inserted, the device will need to be replaced every 3 to 5 years if you wish to continue using this type of birth control. What may interact with this medicine? Do not take this medicine with any of the following medications: -amprenavir -bosentan -fosamprenavir This medicine may also interact with the following medications: -aprepitant -armodafinil -barbiturate medicines for inducing sleep or treating seizures -bexarotene -boceprevir -griseofulvin -medicines to treat seizures like carbamazepine, ethotoin, felbamate, oxcarbazepine, phenytoin, topiramate -modafinil -pioglitazone -rifabutin -rifampin -rifapentine -some medicines to treat HIV infection like atazanavir, efavirenz, indinavir, lopinavir, nelfinavir, tipranavir, ritonavir -St. John's wort -warfarin This list may not describe all possible interactions. Give your health care provider a list of all the medicines, herbs, non-prescription drugs, or dietary supplements you use. Also tell them if you smoke, drink alcohol, or use illegal drugs. Some items may interact with your medicine. What should I watch for while using this medicine? Visit your doctor or health care professional for regular check ups. See your doctor if you or your partner has sexual contact with others, becomes HIV positive, or gets a sexual transmitted disease. This product does not protect you against HIV infection (AIDS) or other sexually transmitted diseases. You can check the placement of the IUD yourself by reaching up to the top of your vagina with clean fingers to feel the threads. Do not pull on the threads.  It is a good  habit to check placement after each menstrual period. Call your doctor right away if you feel more of the IUD than just the threads or if you cannot feel the threads at all. The IUD may come out by itself. You may become pregnant if the device comes out. If you notice that the IUD has come out use a backup birth control method like condoms and call your health care provider. Using tampons will not change the position of the IUD and are okay to use during your period. This IUD can be safely scanned with magnetic resonance imaging (MRI) only under specific conditions. Before you have an MRI, tell your healthcare provider that you have an IUD in place, and which type of IUD you have in place. What side effects may I notice from receiving this medicine? Side effects that you should report to your doctor or health care professional as soon as possible: -allergic reactions like skin rash, itching or hives, swelling of the face, lips, or tongue -fever, flu-like symptoms -genital sores -high blood pressure -no menstrual period for 6 weeks during use -pain, swelling, warmth in the leg -pelvic pain or tenderness -severe or sudden headache -signs of pregnancy -stomach cramping -sudden shortness of breath -trouble with balance, talking, or walking -unusual vaginal bleeding, discharge -yellowing of the eyes or skin Side effects that usually do not require medical attention (report to your doctor or health care professional if they continue or are bothersome): -acne -breast pain -change in sex drive or performance -changes in weight -cramping, dizziness, or faintness while the device is being inserted -headache -irregular menstrual bleeding within first 3 to 6 months of use -nausea This list may not describe all possible side effects. Call your doctor for medical advice about side effects. You may report side effects to FDA at 1-800-FDA-1088. Where should I keep my medicine? This does not  apply. NOTE: This sheet is a summary. It may not cover all possible information. If you have questions about this medicine, talk to your doctor, pharmacist, or health care provider.  2018 Elsevier/Gold Standard (2016-02-15 14:14:56)   Leuprolide injection What is this medicine? LEUPROLIDE (loo PROE lide) is a man-made hormone. It is used to treat the symptoms of prostate cancer. This medicine may also be used to treat children with early onset of puberty. It may be used for other hormonal conditions. This medicine may be used for other purposes; ask your health care provider or pharmacist if you have questions. COMMON BRAND NAME(S): Lupron What should I tell my health care provider before I take this medicine? They need to know if you have any of these conditions: -diabetes -heart disease or previous heart attack -high blood pressure -high cholesterol -pain or difficulty passing urine -spinal cord metastasis -stroke -tobacco smoker -an unusual or allergic reaction to leuprolide, benzyl alcohol, other medicines, foods, dyes, or preservatives -pregnant or trying to get pregnant -breast-feeding How should I use this medicine? This medicine is for injection under the skin or into a muscle. You will be taught how to prepare and give this medicine. Use exactly as directed. Take your medicine at regular intervals. Do not take your medicine more often than directed. It is important that you put your used needles and syringes in a special sharps container. Do not put them in a trash can. If you do not have a sharps container, call your pharmacist or healthcare provider to get one. A special MedGuide will be given to you  by the pharmacist with each prescription and refill. Be sure to read this information carefully each time. Talk to your pediatrician regarding the use of this medicine in children. While this medicine may be prescribed for children as young as 8 years for selected conditions,  precautions do apply. Overdosage: If you think you have taken too much of this medicine contact a poison control center or emergency room at once. NOTE: This medicine is only for you. Do not share this medicine with others. What if I miss a dose? If you miss a dose, take it as soon as you can. If it is almost time for your next dose, take only that dose. Do not take double or extra doses. What may interact with this medicine? Do not take this medicine with any of the following medications: -chasteberry This medicine may also interact with the following medications: -herbal or dietary supplements, like black cohosh or DHEA -female hormones, like estrogens or progestins and birth control pills, patches, rings, or injections -female hormones, like testosterone This list may not describe all possible interactions. Give your health care provider a list of all the medicines, herbs, non-prescription drugs, or dietary supplements you use. Also tell them if you smoke, drink alcohol, or use illegal drugs. Some items may interact with your medicine. What should I watch for while using this medicine? Visit your doctor or health care professional for regular checks on your progress. During the first week, your symptoms may get worse, but then will improve as you continue your treatment. You may get hot flashes, increased bone pain, increased difficulty passing urine, or an aggravation of nerve symptoms. Discuss these effects with your doctor or health care professional, some of them may improve with continued use of this medicine. Female patients may experience a menstrual cycle or spotting during the first 2 months of therapy with this medicine. If this continues, contact your doctor or health care professional. What side effects may I notice from receiving this medicine? Side effects that you should report to your doctor or health care professional as soon as possible: -allergic reactions like skin rash,  itching or hives, swelling of the face, lips, or tongue -breathing problems -chest pain -depression or memory disorders -pain in your legs or groin -pain at site where injected -severe headache -swelling of the feet and legs -visual changes -vomiting Side effects that usually do not require medical attention (report to your doctor or health care professional if they continue or are bothersome): -breast swelling or tenderness -decrease in sex drive or performance -diarrhea -hot flashes -loss of appetite -muscle, joint, or bone pains -nausea -redness or irritation at site where injected -skin problems or acne This list may not describe all possible side effects. Call your doctor for medical advice about side effects. You may report side effects to FDA at 1-800-FDA-1088. Where should I keep my medicine? Keep out of the reach of children. Store below 25 degrees C (77 degrees F). Do not freeze. Protect from light. Do not use if it is not clear or if there are particles present. Throw away any unused medicine after the expiration date. NOTE: This sheet is a summary. It may not cover all possible information. If you have questions about this medicine, talk to your doctor, pharmacist, or health care provider.  2018 Elsevier/Gold Standard (2015-12-24 10:54:35)   Danazol capsules What is this medicine? DANAZOL (DA na zole) is used in women to treat endometriosis and the symptoms of fibrocystic breast disease. This medicine  may also be used in men and women to prevent serious allergic reactions known as angioedema. This medicine may be used for other purposes; ask your health care provider or pharmacist if you have questions. COMMON BRAND NAME(S): Danocrine What should I tell my health care provider before I take this medicine? They need to know if you have any of these conditions: -breast cancer -heart disease -kidney disease -liver disease -porphyria -unusual vaginal bleeding -an  unusual or allergic reaction to danazol, other medicines, foods, dyes, or preservatives -pregnant or trying to get pregnant -breast-feeding How should I use this medicine? Take this medicine by mouth with a glass of water. Follow the directions on the prescription label. Take this medicine with food to decrease stomach upset. Take your doses at regular intervals. Do not take your medicine more often than directed. Talk to your pediatrician regarding the use of this medicine in children. Special care may be needed. Overdosage: If you think you have taken too much of this medicine contact a poison control center or emergency room at once. NOTE: This medicine is only for you. Do not share this medicine with others. What if I miss a dose? If you miss a dose, take it as soon as you can. If it is almost time for your next dose, take only that dose. Do not take double or extra doses. What may interact with this medicine? Do not take this medicine with any of the following medications: -cisapride -pimozide -ranolazine This medicine may also interact with the following medications: -carbamazepine -medicines that treat or prevent blood clots like warfarin This list may not describe all possible interactions. Give your health care provider a list of all the medicines, herbs, non-prescription drugs, or dietary supplements you use. Also tell them if you smoke, drink alcohol, or use illegal drugs. Some items may interact with your medicine. What should I watch for while using this medicine? Check with your doctor or health care professional if you are a female patient and notice any changes in your voice, decrease in breast size, or if hair starts growing on your face. This medicine should not be used in pregnancy. You should use a non-hormonal form of birth control while on this medicine. If you become pregnant or think you may be pregnant while you are taking this medicine, you should stop taking this  medicine and contact your doctor or health care professional. This medicine may cause risk to a female fetus. This medicine can affect your menstrual cycle and you may stop having menstrual periods. These will return to normal within 2 to 3 months after you stop taking this medicine. What side effects may I notice from receiving this medicine? Side effects that you should report to your doctor or health care professional as soon as possible: -allergic reactions like skin rash, itching or hives, swelling of the face, lips, or tongue -changes in vision -dark urine -decrease in breast size -hair loss or unusual hair growth -headache -irregular vaginal bleeding, spotting -nausea, vomiting, stomach pain -redness, blistering, peeling or loosening of the skin, including inside the mouth -unusual bleeding or bruising -unusual swelling of feet or ankles -unusually weak or tired -voice changes -weight gain -yellowing of the skin or eyes Side effects that usually do not require medical attention (report to your doctor or health care professional if they continue or are bothersome): -acne, oily skin -hot flashes, sweating -mood changes -vaginal dryness or irritation This list may not describe all possible side effects. Call your  doctor for medical advice about side effects. You may report side effects to FDA at 1-800-FDA-1088. Where should I keep my medicine? Keep out of the reach of children. Store at room temperature between 15 and 30 degrees C (59 and 86 degrees F). Throw away any unused medicine after the expiration date. NOTE: This sheet is a summary. It may not cover all possible information. If you have questions about this medicine, talk to your doctor, pharmacist, or health care provider.  2018 Elsevier/Gold Standard (2007-08-26 16:38:33) Laparoscopically Assisted Vaginal Hysterectomy, Care After Refer to this sheet in the next few weeks. These instructions provide you with information  on caring for yourself after your procedure. Your health care provider may also give you more specific instructions. Your treatment has been planned according to current medical practices, but problems sometimes occur. Call your health care provider if you have any problems or questions after your procedure. What can I expect after the procedure? After your procedure, it is typical to have the following:  Abdominal pain. You will be given pain medicine to control it.  Sore throat from the breathing tube that was inserted during surgery.  Follow these instructions at home:  Only take over-the-counter or prescription medicines for pain, discomfort, or fever as directed by your health care provider.  Do not take aspirin. It can cause bleeding.  Do not drive when taking pain medicine.  Follow your health care provider's advice regarding diet, exercise, lifting, driving, and general activities.  Resume your usual diet as directed and allowed.  Get plenty of rest and sleep.  Do not douche, use tampons, or have sexual intercourse for at least 6 weeks, or until your health care provider gives you permission.  Change your bandages (dressings) as directed by your health care provider.  Monitor your temperature and notify your health care provider of a fever.  Take showers instead of baths for 2-3 weeks.  Do not drink alcohol until your health care provider gives you permission.  If you develop constipation, you may take a mild laxative with your health care provider's permission. Bran foods may help with constipation problems. Drinking enough fluids to keep your urine clear or pale yellow may help as well.  Try to have someone home with you for 1-2 weeks to help around the house.  Keep all of your follow-up appointments as directed by your health care provider. Contact a health care provider if:  You have swelling, redness, or increasing pain around your incision sites.  You have pus  coming from your incision.  You notice a bad smell coming from your incision.  Your incision breaks open.  You feel dizzy or lightheaded.  You have pain or bleeding when you urinate.  You have persistent diarrhea.  You have persistent nausea and vomiting.  You have abnormal vaginal discharge.  You have a rash.  You have any type of abnormal reaction or develop an allergy to your medicine.  You have poor pain control with your prescribed medicine. Get help right away if:  You have a fever.  You have severe abdominal pain.  You have chest pain.  You have shortness of breath.  You faint.  You have pain, swelling, or redness in your leg.  You have heavy vaginal bleeding with blood clots. This information is not intended to replace advice given to you by your health care provider. Make sure you discuss any questions you have with your health care provider. Document Released: 04/24/2011 Document Revised: 10/11/2015  Document Reviewed: 11/18/2012 Elsevier Interactive Patient Education  2017 [employer].

## 2017-10-05 DIAGNOSIS — G43909 Migraine, unspecified, not intractable, without status migrainosus: Secondary | ICD-10-CM | POA: Diagnosis not present

## 2017-10-05 DIAGNOSIS — R51 Headache: Secondary | ICD-10-CM | POA: Diagnosis not present

## 2017-10-05 DIAGNOSIS — G894 Chronic pain syndrome: Secondary | ICD-10-CM | POA: Diagnosis not present

## 2017-10-06 ENCOUNTER — Telehealth: Payer: Self-pay | Admitting: Obstetrics and Gynecology

## 2017-10-06 NOTE — Telephone Encounter (Signed)
The patient called and stated that she would like to speak with [name] in regards to her having a few issues with her medication that was sent to her pharmacy after her surgery. Please advise.

## 2017-10-06 NOTE — Telephone Encounter (Signed)
Pt is s/p lap with bx 09/21/2017. I spoke with Dr Metta Clines on 09/22/17 he was to supplement pts pain meds for her after surgery. Pt states he did not give her any. He advised her to take more of her current oxycodone rx. She did that and now she is out of her oxycodone. She saw Dr. Metta Clines today and he gave her a rx but she is unable to get it filled until Monday. She is worried about going through withdrawal. Do I need to call Dr. Metta Clines or are you willing to give her a refill until Monday? Pt aware you will get this rx in the am.

## 2017-10-07 MED ORDER — OXYCODONE HCL 10 MG PO TABS: 10.0000 mg | ORAL_TABLET | Freq: Four times a day (QID) | ORAL | 0 refills | Status: AC

## 2017-10-07 NOTE — Telephone Encounter (Signed)
Pt aware. Rx left up front for p/u.

## 2017-11-02 DIAGNOSIS — G43909 Migraine, unspecified, not intractable, without status migrainosus: Secondary | ICD-10-CM | POA: Diagnosis not present

## 2017-11-02 DIAGNOSIS — R51 Headache: Secondary | ICD-10-CM | POA: Diagnosis not present

## 2017-11-02 DIAGNOSIS — G894 Chronic pain syndrome: Secondary | ICD-10-CM | POA: Diagnosis not present

## 2017-11-30 DIAGNOSIS — R51 Headache: Secondary | ICD-10-CM | POA: Diagnosis not present

## 2017-11-30 DIAGNOSIS — G894 Chronic pain syndrome: Secondary | ICD-10-CM | POA: Diagnosis not present

## 2017-11-30 DIAGNOSIS — G43909 Migraine, unspecified, not intractable, without status migrainosus: Secondary | ICD-10-CM | POA: Diagnosis not present

## 2017-12-28 DIAGNOSIS — G894 Chronic pain syndrome: Secondary | ICD-10-CM | POA: Diagnosis not present

## 2017-12-28 DIAGNOSIS — R51 Headache: Secondary | ICD-10-CM | POA: Diagnosis not present

## 2017-12-28 DIAGNOSIS — G43909 Migraine, unspecified, not intractable, without status migrainosus: Secondary | ICD-10-CM | POA: Diagnosis not present

## 2018-01-05 ENCOUNTER — Encounter: Payer: 59 | Admitting: Obstetrics and Gynecology

## 2018-01-25 DIAGNOSIS — G43909 Migraine, unspecified, not intractable, without status migrainosus: Secondary | ICD-10-CM | POA: Diagnosis not present

## 2018-01-25 DIAGNOSIS — G894 Chronic pain syndrome: Secondary | ICD-10-CM | POA: Diagnosis not present

## 2018-01-25 DIAGNOSIS — R51 Headache: Secondary | ICD-10-CM | POA: Diagnosis not present

## 2018-02-22 DIAGNOSIS — G43909 Migraine, unspecified, not intractable, without status migrainosus: Secondary | ICD-10-CM | POA: Diagnosis not present

## 2018-02-22 DIAGNOSIS — Z5181 Encounter for therapeutic drug level monitoring: Secondary | ICD-10-CM | POA: Diagnosis not present

## 2018-02-22 DIAGNOSIS — G894 Chronic pain syndrome: Secondary | ICD-10-CM | POA: Diagnosis not present

## 2018-03-03 DIAGNOSIS — Z23 Encounter for immunization: Secondary | ICD-10-CM | POA: Diagnosis not present

## 2018-03-03 DIAGNOSIS — L03221 Cellulitis of neck: Secondary | ICD-10-CM | POA: Diagnosis not present

## 2018-03-22 DIAGNOSIS — G894 Chronic pain syndrome: Secondary | ICD-10-CM | POA: Diagnosis not present

## 2018-03-22 DIAGNOSIS — Z5181 Encounter for therapeutic drug level monitoring: Secondary | ICD-10-CM | POA: Diagnosis not present

## 2018-03-22 DIAGNOSIS — G43909 Migraine, unspecified, not intractable, without status migrainosus: Secondary | ICD-10-CM | POA: Diagnosis not present

## 2018-04-21 DIAGNOSIS — G43909 Migraine, unspecified, not intractable, without status migrainosus: Secondary | ICD-10-CM | POA: Diagnosis not present

## 2018-04-21 DIAGNOSIS — Z5181 Encounter for therapeutic drug level monitoring: Secondary | ICD-10-CM | POA: Diagnosis not present

## 2018-04-21 DIAGNOSIS — G894 Chronic pain syndrome: Secondary | ICD-10-CM | POA: Diagnosis not present

## 2018-05-20 DIAGNOSIS — G43909 Migraine, unspecified, not intractable, without status migrainosus: Secondary | ICD-10-CM | POA: Diagnosis not present

## 2018-05-20 DIAGNOSIS — Z5181 Encounter for therapeutic drug level monitoring: Secondary | ICD-10-CM | POA: Diagnosis not present

## 2018-05-20 DIAGNOSIS — G894 Chronic pain syndrome: Secondary | ICD-10-CM | POA: Diagnosis not present

## 2018-06-16 DIAGNOSIS — G894 Chronic pain syndrome: Secondary | ICD-10-CM | POA: Diagnosis not present

## 2018-06-16 DIAGNOSIS — Z5181 Encounter for therapeutic drug level monitoring: Secondary | ICD-10-CM | POA: Diagnosis not present

## 2018-06-16 DIAGNOSIS — G43909 Migraine, unspecified, not intractable, without status migrainosus: Secondary | ICD-10-CM | POA: Diagnosis not present

## 2018-07-12 DIAGNOSIS — G894 Chronic pain syndrome: Secondary | ICD-10-CM | POA: Diagnosis not present

## 2018-08-09 DIAGNOSIS — G894 Chronic pain syndrome: Secondary | ICD-10-CM | POA: Diagnosis not present

## 2018-08-31 DIAGNOSIS — I080 Rheumatic disorders of both mitral and aortic valves: Secondary | ICD-10-CM | POA: Diagnosis not present

## 2018-09-06 DIAGNOSIS — G894 Chronic pain syndrome: Secondary | ICD-10-CM | POA: Diagnosis not present

## 2018-10-04 DIAGNOSIS — G894 Chronic pain syndrome: Secondary | ICD-10-CM | POA: Diagnosis not present

## 2019-03-07 ENCOUNTER — Other Ambulatory Visit: Payer: Self-pay

## 2019-03-07 DIAGNOSIS — Z20822 Contact with and (suspected) exposure to covid-19: Secondary | ICD-10-CM

## 2019-03-09 LAB — NOVEL CORONAVIRUS, NAA: SARS-CoV-2, NAA: NOT DETECTED

## 2019-06-28 ENCOUNTER — Ambulatory Visit
Admission: RE | Admit: 2019-06-28 | Discharge: 2019-06-28 | Disposition: A | Discharge status: Home / Self Care | Payer: 59 | Source: Ambulatory Visit | Attending: Cardiology | Admitting: Cardiology

## 2019-06-28 ENCOUNTER — Other Ambulatory Visit: Payer: Self-pay | Admitting: Cardiology

## 2019-06-28 ENCOUNTER — Ambulatory Visit
Admission: RE | Admit: 2019-06-28 | Discharge: 2019-06-28 | Disposition: A | Discharge status: Home / Self Care | Payer: 59 | Source: Ambulatory Visit | Attending: Internal Medicine | Admitting: Internal Medicine

## 2019-06-28 DIAGNOSIS — R079 Chest pain, unspecified: Secondary | ICD-10-CM | POA: Diagnosis present

## 2019-09-26 ENCOUNTER — Other Ambulatory Visit: Payer: Self-pay | Admitting: *Deleted

## 2019-09-26 MED ORDER — GABAPENTIN 300 MG PO CAPS: 600.0000 mg | ORAL_CAPSULE | Freq: Two times a day (BID) | ORAL | 3 refills | Status: DC

## 2019-10-24 ENCOUNTER — Telehealth: Payer: Self-pay | Admitting: *Deleted

## 2019-10-24 NOTE — Telephone Encounter (Signed)
Order was faxed to pain clinic stating that Dr. Juel Burrow would no longer prescribe opioids to patient per pain management request. It was faxed on 09/21/2019 to [phone].

## 2019-11-22 ENCOUNTER — Other Ambulatory Visit: Payer: Self-pay

## 2019-11-22 MED ORDER — TOPIRAMATE 25 MG PO TABS: 25.0000 mg | ORAL_TABLET | Freq: Two times a day (BID) | ORAL | 0 refills | Status: DC

## 2019-12-16 ENCOUNTER — Other Ambulatory Visit: Payer: Self-pay | Admitting: *Deleted

## 2019-12-16 MED ORDER — GABAPENTIN 300 MG PO CAPS: 600.0000 mg | ORAL_CAPSULE | Freq: Two times a day (BID) | ORAL | 3 refills | Status: DC

## 2020-01-18 ENCOUNTER — Other Ambulatory Visit: Payer: Self-pay | Admitting: *Deleted

## 2020-01-18 MED ORDER — GABAPENTIN 300 MG PO CAPS: 600.0000 mg | ORAL_CAPSULE | Freq: Four times a day (QID) | ORAL | 3 refills | Status: DC

## 2020-02-14 ENCOUNTER — Other Ambulatory Visit: Payer: Self-pay | Admitting: *Deleted

## 2020-02-14 MED ORDER — TOPIRAMATE 25 MG PO TABS: 25.0000 mg | ORAL_TABLET | Freq: Two times a day (BID) | ORAL | 0 refills | Status: DC

## 2020-02-29 ENCOUNTER — Other Ambulatory Visit: Payer: Self-pay | Admitting: Obstetrics and Gynecology

## 2020-02-29 DIAGNOSIS — Z1231 Encounter for screening mammogram for malignant neoplasm of breast: Secondary | ICD-10-CM

## 2020-03-29 DIAGNOSIS — N912 Amenorrhea, unspecified: Secondary | ICD-10-CM | POA: Insufficient documentation

## 2020-04-02 ENCOUNTER — Ambulatory Visit
Admission: RE | Admit: 2020-04-02 | Discharge: 2020-04-02 | Disposition: A | Discharge status: Home / Self Care | Payer: 59 | Source: Ambulatory Visit | Attending: Obstetrics and Gynecology | Admitting: Obstetrics and Gynecology

## 2020-04-02 ENCOUNTER — Other Ambulatory Visit: Payer: Self-pay

## 2020-04-02 DIAGNOSIS — Z1231 Encounter for screening mammogram for malignant neoplasm of breast: Secondary | ICD-10-CM | POA: Insufficient documentation

## 2020-04-09 ENCOUNTER — Other Ambulatory Visit: Payer: Self-pay | Admitting: Obstetrics and Gynecology

## 2020-04-09 DIAGNOSIS — R928 Other abnormal and inconclusive findings on diagnostic imaging of breast: Secondary | ICD-10-CM

## 2020-04-09 DIAGNOSIS — N6310 Unspecified lump in the right breast, unspecified quadrant: Secondary | ICD-10-CM

## 2020-04-09 DIAGNOSIS — N6320 Unspecified lump in the left breast, unspecified quadrant: Secondary | ICD-10-CM

## 2020-05-01 ENCOUNTER — Other Ambulatory Visit: Payer: Self-pay | Admitting: Internal Medicine

## 2020-05-15 ENCOUNTER — Other Ambulatory Visit: Payer: Self-pay | Admitting: Internal Medicine

## 2020-05-21 ENCOUNTER — Other Ambulatory Visit: Payer: Self-pay

## 2020-05-21 ENCOUNTER — Encounter: Payer: Self-pay | Admitting: Internal Medicine

## 2020-05-21 ENCOUNTER — Ambulatory Visit: Payer: 59 | Admitting: Internal Medicine

## 2020-05-21 VITALS — BP 114/76 | HR 60 | Ht 64.0 in | Wt 163.3 lb

## 2020-05-21 DIAGNOSIS — M5481 Occipital neuralgia: Secondary | ICD-10-CM | POA: Diagnosis not present

## 2020-05-21 DIAGNOSIS — N946 Dysmenorrhea, unspecified: Secondary | ICD-10-CM | POA: Diagnosis not present

## 2020-05-21 DIAGNOSIS — G5603 Carpal tunnel syndrome, bilateral upper limbs: Secondary | ICD-10-CM

## 2020-05-21 DIAGNOSIS — R102 Pelvic and perineal pain: Secondary | ICD-10-CM | POA: Diagnosis not present

## 2020-05-21 NOTE — Assessment & Plan Note (Signed)
Patient had an MRI done in 2015 which was unremarkable.  She has recently seen the gynecologist who referred her to endocrinologist

## 2020-05-21 NOTE — Progress Notes (Signed)
Established Patient Office Visit  Subjective:  Patient ID: Isabel Robinson, female    DOB: July 14, 1975  Age: 45 y.o. MRN: [id number]  CC:  Chief Complaint  Patient presents with  . Medication Refill    HPI  Isabel Robinson presents forgeneral check up   And  Lab test  Past Medical History:  Diagnosis Date  . Anemia   . Anxiety   . Asthma    CHILDHOOD ASTHMA  . Depression   . Endometriosis   . Fibromyalgia   . GERD (gastroesophageal reflux disease)   . Headache    MIGRAINES  . Heart murmur    ASYMPTOMATIC  . Rash    scalp  . Sepsis ([place]) 2017    Past Surgical History:  Procedure Laterality Date  . CHOLECYSTECTOMY    . LAPAROSCOPY N/A 09/21/2017   Procedure: LAPAROSCOPY DIAGNOSTIC WITH BIOPSIES;  Surgeon: Brayton Mars, MD;  Location: [facility] ORS;  Service: Gynecology;  Laterality: N/A;    Family History  Problem Relation Age of Onset  . Depression Mother   . Arthritis Mother   . Hypertension Mother   . Endometriosis Mother   . Heart disease Father   . Deep vein thrombosis Father   . Hypertension Father   . Diabetes Father   . Ovarian cancer Neg Hx   . Breast cancer Neg Hx     Social History   Socioeconomic History  . Marital status: Married    Spouse name: Not on file  . Number of children: Not on file  . Years of education: Not on file  . Highest education level: Not on file  Occupational History  . Not on file  Tobacco Use  . Smoking status: Former Smoker    Packs/day: 0.50    Years: 10.00    Pack years: 5.00    Types: Cigarettes    Quit date: 09/16/2013    Years since quitting: 6.6  . Smokeless tobacco: Never Used  Vaping Use  . Vaping Use: Every day  Substance and Sexual Activity  . Alcohol use: Yes    Alcohol/week: 0.0 standard drinks    Comment: RARE  . Drug use: No  . Sexual activity: Not Currently    Birth control/protection: Other-see comments    Comment: vasectomy  Other Topics Concern  . Not on file  Social History  Narrative  . Not on file   Social Determinants of Health   Financial Resource Strain: Not on file  Food Insecurity: Not on file  Transportation Needs: Not on file  Physical Activity: Not on file  Stress: Not on file  Social Connections: Not on file  Intimate Partner Violence: Not on file     Current Outpatient Medications:  .  acetaminophen (TYLENOL 8 HOUR ARTHRITIS PAIN) 650 MG CR tablet, Take 1,300 mg by mouth daily. , Disp: , Rfl:  .  escitalopram (LEXAPRO) 20 MG tablet, Take 20 mg by mouth daily., Disp: , Rfl:  .  gabapentin (NEURONTIN) 300 MG capsule, TAKE 2 CAPSULES (600 MG TOTAL) BY MOUTH IN THE MORNING, AT NOON, IN THE EVENING, AND AT BEDTIME., Disp: 120 capsule, Rfl: 3 .  ibuprofen (ADVIL,MOTRIN) 200 MG tablet, Take 800 mg by mouth 2 (two) times daily as needed (FOR PAIN.). , Disp: , Rfl:  .  LATUDA 60 MG TABS, Take 1 tablet by mouth at bedtime., Disp: , Rfl:  .  omeprazole (PRILOSEC) 20 MG capsule, Take 20 mg by mouth 2 (two) times daily before  a meal., Disp: , Rfl:  .  Oxycodone HCl 10 MG TABS, Limit 1 tab by mouth 4-6 times per day if tolerated (Patient taking differently: Take 10 mg by mouth every 4 (four) hours as needed (FOR PAIN/MUSCLE SPASMS WITH HIPS OR LEGS). Limit 1 tab by mouth 4-6 times per day if tolerated), Disp: 180 tablet, Rfl: 0 .  topiramate (TOPAMAX) 25 MG tablet, TAKE 1 TABLET BY MOUTH TWICE A DAY, Disp: 180 tablet, Rfl: 0   No Known Allergies  ROS Review of Systems  Constitutional: Negative.   HENT: Negative.  Negative for rhinorrhea.   Eyes: Negative.   Respiratory: Negative.  Negative for chest tightness.   Cardiovascular: Negative.   Gastrointestinal: Negative.   Endocrine: Negative.        Postmenopausal symptoms  Genitourinary: Negative.  Negative for hematuria.  Musculoskeletal: Positive for arthralgias.  Skin: Negative.   Allergic/Immunologic: Negative.   Neurological: Negative.   Hematological: Negative.   Psychiatric/Behavioral:  Negative.  Negative for agitation, confusion and dysphoric mood.  All other systems reviewed and are negative.     Objective:    Physical Exam Vitals reviewed.  Constitutional:      Appearance: Normal appearance.  HENT:     Mouth/Throat:     Mouth: Mucous membranes are moist.  Eyes:     Pupils: Pupils are equal, round, and reactive to light.  Neck:     Vascular: No carotid bruit.  Cardiovascular:     Rate and Rhythm: Normal rate and regular rhythm.     Pulses: Normal pulses.     Heart sounds: Normal heart sounds.  Pulmonary:     Effort: Pulmonary effort is normal.     Breath sounds: Normal breath sounds.  Abdominal:     General: Bowel sounds are normal.     Palpations: Abdomen is soft. There is no hepatomegaly, splenomegaly or mass.     Tenderness: There is no abdominal tenderness.     Hernia: No hernia is present.  Musculoskeletal:        General: No tenderness.     Cervical back: Neck supple.     Right lower leg: No edema.     Left lower leg: No edema.  Skin:    Findings: No rash.  Neurological:     Mental Status: She is alert and oriented to person, place, and time.     Motor: No weakness.  Psychiatric:        Mood and Affect: Mood and affect normal.        Behavior: Behavior normal.     BP 114/76   Pulse 60   Ht 5\' 4"  (1.626 m)   Wt 163 lb 4.8 oz (74.1 kg)   LMP 08/27/2017 (Exact Date)   BMI 28.03 kg/m  Wt Readings from Last 3 Encounters:  05/21/20 163 lb 4.8 oz (74.1 kg)  10/01/17 160 lb 9.6 oz (72.8 kg)  09/21/17 159 lb (72.1 kg)     Health Maintenance Due  Topic Date Due  . Hepatitis C Screening  Never done  . COVID-19 Vaccine (1) Never done  . HIV Screening  Never done  . TETANUS/TDAP  Never done  . PAP SMEAR-Modifier  Never done  . COLONOSCOPY (Pts 45-83yrs Insurance coverage will need to be confirmed)  Never done    There are no preventive care reminders to display for this patient.  No results found for: TSH Lab Results  Component  Value Date   WBC 5.2 09/18/2017  HGB 11.9 (L) 09/18/2017   HCT 35.8 09/18/2017   MCV 86.1 09/18/2017   PLT 326 09/18/2017   Lab Results  Component Value Date   NA 140 02/17/2014   K 3.2 (L) 02/17/2014   CO2 24 02/17/2014   GLUCOSE 114 (H) 02/17/2014   BUN 10 02/17/2014   CREATININE 1.02 02/17/2014   BILITOT 0.3 02/16/2014   ALKPHOS 189 (H) 02/16/2014   AST 22 02/16/2014   ALT 21 02/16/2014   PROT 7.0 02/16/2014   ALBUMIN 2.8 (L) 02/16/2014   CALCIUM 7.6 (L) 02/17/2014   ANIONGAP 6 (L) 02/17/2014   No results found for: CHOL No results found for: HDL No results found for: LDLCALC No results found for: TRIG No results found for: CHOLHDL No results found for: OHYW7P    Assessment & Plan:   Problem List Items Addressed This Visit      Nervous and Auditory   Carpal tunnel syndrome - Primary    Carpal tunnel is stable at the present time      Relevant Medications   escitalopram (LEXAPRO) 20 MG tablet   LATUDA 60 MG TABS     Genitourinary   Dysmenorrhea    Patient has seen a gynecologist she did some hormones test and patient is being sent to endocrinologist for further evaluation.  He also needs a colonoscopy so she will be scheduled for that        Other   Bilateral occipital neuralgia    Patient is taking gabapentin and Topamax for that she was advised to stay on the same dosage      Pelvic pain in female    Patient had an MRI done in 2015 which was unremarkable.  She has recently seen the gynecologist who referred her to endocrinologist         No orders of the defined types were placed in this encounter.   Follow-up: No follow-ups on file.    Corky Downs, MD

## 2020-05-21 NOTE — Assessment & Plan Note (Signed)
Patient has seen a gynecologist she did some hormones test and patient is being sent to endocrinologist for further evaluation.  He also needs a colonoscopy so she will be scheduled for that

## 2020-05-21 NOTE — Assessment & Plan Note (Signed)
Carpal tunnel is stable at the present time

## 2020-05-21 NOTE — Assessment & Plan Note (Signed)
Patient is taking gabapentin and Topamax for that she was advised to stay on the same dosage

## 2020-05-24 ENCOUNTER — Other Ambulatory Visit: Payer: Self-pay | Admitting: Internal Medicine

## 2020-05-24 DIAGNOSIS — E230 Hypopituitarism: Secondary | ICD-10-CM

## 2020-05-28 ENCOUNTER — Ambulatory Visit
Admission: RE | Admit: 2020-05-28 | Discharge: 2020-05-28 | Disposition: A | Discharge status: Home / Self Care | Payer: 59 | Source: Ambulatory Visit | Attending: Obstetrics and Gynecology | Admitting: Obstetrics and Gynecology

## 2020-05-28 ENCOUNTER — Other Ambulatory Visit: Payer: Self-pay

## 2020-05-28 DIAGNOSIS — N6320 Unspecified lump in the left breast, unspecified quadrant: Secondary | ICD-10-CM

## 2020-05-28 DIAGNOSIS — N6310 Unspecified lump in the right breast, unspecified quadrant: Secondary | ICD-10-CM

## 2020-05-28 DIAGNOSIS — R928 Other abnormal and inconclusive findings on diagnostic imaging of breast: Secondary | ICD-10-CM

## 2020-06-12 ENCOUNTER — Other Ambulatory Visit: Payer: Self-pay

## 2020-06-12 ENCOUNTER — Ambulatory Visit
Admission: RE | Admit: 2020-06-12 | Discharge: 2020-06-12 | Disposition: A | Discharge status: Home / Self Care | Payer: 59 | Source: Ambulatory Visit | Attending: Internal Medicine | Admitting: Internal Medicine

## 2020-06-12 DIAGNOSIS — E230 Hypopituitarism: Secondary | ICD-10-CM | POA: Diagnosis not present

## 2020-06-12 MED ORDER — GADOBUTROL 1 MMOL/ML IV SOLN
4.0000 mL | Freq: Once | INTRAVENOUS | Status: AC | PRN
  Administered 2020-06-12: 4 mL via INTRAVENOUS

## 2020-06-14 ENCOUNTER — Other Ambulatory Visit: Payer: Self-pay

## 2020-06-14 DIAGNOSIS — Z1211 Encounter for screening for malignant neoplasm of colon: Secondary | ICD-10-CM

## 2020-06-15 ENCOUNTER — Other Ambulatory Visit: Payer: Self-pay | Admitting: Internal Medicine

## 2020-07-02 ENCOUNTER — Other Ambulatory Visit: Payer: Self-pay | Admitting: Internal Medicine

## 2020-07-02 ENCOUNTER — Other Ambulatory Visit: Payer: Self-pay | Admitting: *Deleted

## 2020-07-02 MED ORDER — DIAZEPAM 5 MG PO TABS: 5.0000 mg | ORAL_TABLET | Freq: Two times a day (BID) | ORAL | 1 refills | Status: DC

## 2020-07-04 ENCOUNTER — Telehealth (INDEPENDENT_AMBULATORY_CARE_PROVIDER_SITE_OTHER): Payer: Self-pay | Admitting: Gastroenterology

## 2020-07-04 ENCOUNTER — Other Ambulatory Visit: Payer: Self-pay

## 2020-07-04 ENCOUNTER — Telehealth: Payer: Self-pay

## 2020-07-04 DIAGNOSIS — Z1211 Encounter for screening for malignant neoplasm of colon: Secondary | ICD-10-CM

## 2020-07-04 DIAGNOSIS — N950 Postmenopausal bleeding: Secondary | ICD-10-CM | POA: Insufficient documentation

## 2020-07-04 DIAGNOSIS — F32A Depression, unspecified: Secondary | ICD-10-CM | POA: Insufficient documentation

## 2020-07-04 DIAGNOSIS — R51 Headache: Secondary | ICD-10-CM | POA: Insufficient documentation

## 2020-07-04 DIAGNOSIS — R519 Headache, unspecified: Secondary | ICD-10-CM | POA: Insufficient documentation

## 2020-07-04 DIAGNOSIS — F419 Anxiety disorder, unspecified: Secondary | ICD-10-CM | POA: Insufficient documentation

## 2020-07-04 MED ORDER — NA SULFATE-K SULFATE-MG SULF 17.5-3.13-1.6 GM/177ML PO SOLN: 1.0000 | Freq: Once | ORAL | 0 refills | Status: AC

## 2020-07-04 NOTE — Progress Notes (Signed)
Gastroenterology Pre-Procedure Review  Request Date: 07/17/20 Requesting Physician: Dr. Fontaine No  PATIENT REVIEW QUESTIONS: The patient responded to the following health history questions as indicated:    1. Are you having any GI issues? no 2. Do you have a personal history of Polyps? no 3. Do you have a family history of Colon Cancer or Polyps? yes (Paternal grandmother colon cancer, maternal grandparent stomach cancer) 4. Diabetes Mellitus? no 5. Joint replacements in the past 12 months?no 6. Major health problems in the past 3 months?no 7. Any artificial heart valves, MVP, or defibrillator?no    MEDICATIONS & ALLERGIES:    Patient reports the following regarding taking any anticoagulation/antiplatelet therapy:   Plavix, Coumadin, Eliquis, Xarelto, Lovenox, Pradaxa, Brilinta, or Effient? no Aspirin? no  Patient confirms/reports the following medications:  Current Outpatient Medications  Medication Sig Dispense Refill  . acetaminophen (TYLENOL 8 HOUR ARTHRITIS PAIN) 650 MG CR tablet Take 1,300 mg by mouth daily.     . Buprenorphine HCl-Naloxone HCl 8-2 MG FILM     . escitalopram (LEXAPRO) 20 MG tablet Take 20 mg by mouth daily.    Marland Kitchen gabapentin (NEURONTIN) 300 MG capsule TAKE 2 CAPSULES (600 MG TOTAL) BY MOUTH IN THE MORNING, AT NOON, IN THE EVENING, AND AT BEDTIME. 120 capsule 3  . ibuprofen (ADVIL,MOTRIN) 200 MG tablet Take 800 mg by mouth 2 (two) times daily as needed (FOR PAIN.).     Marland Kitchen LATUDA 60 MG TABS Take 1 tablet by mouth at bedtime.    . Na Sulfate-K Sulfate-Mg Sulf 17.5-3.13-1.6 GM/177ML SOLN Take 1 kit by mouth once for 1 dose. 354 mL 0  . omeprazole (PRILOSEC) 20 MG capsule Take 20 mg by mouth 2 (two) times daily before a meal.    . topiramate (TOPAMAX) 25 MG tablet TAKE 1 TABLET BY MOUTH TWICE A DAY 180 tablet 0  . diazepam (VALIUM) 5 MG tablet Take 1 tablet (5 mg total) by mouth 2 (two) times daily. (Patient not taking: Reported on 07/04/2020) 60 tablet 1  . Oxycodone  HCl 10 MG TABS Limit 1 tab by mouth 4-6 times per day if tolerated (Patient taking differently: Take 10 mg by mouth every 4 (four) hours as needed (FOR PAIN/MUSCLE SPASMS WITH HIPS OR LEGS). Limit 1 tab by mouth 4-6 times per day if tolerated) 180 tablet 0   No current facility-administered medications for this visit.    Patient confirms/reports the following allergies:  No Known Allergies  No orders of the defined types were placed in this encounter.   AUTHORIZATION INFORMATION Primary Insurance: 1D#: Group #:  Secondary Insurance: 1D#: Group #:  SCHEDULE INFORMATION: Date: Tuesday 07/17/20 Time: Location:[facility]

## 2020-07-04 NOTE — Telephone Encounter (Signed)
Attempted to contact patient for her 2:00pm nurse triage call.  I was unable to reach her.  LVM for her to call me back directly to schedule her colonoscopy.   Thanks,  [place], [place]

## 2020-07-05 ENCOUNTER — Telehealth: Payer: Self-pay | Admitting: Gastroenterology

## 2020-07-05 NOTE — Telephone Encounter (Signed)
Patient called wants less expensive alternative for prep to be called in, please advise

## 2020-07-09 ENCOUNTER — Other Ambulatory Visit: Payer: Self-pay

## 2020-07-09 MED ORDER — PEG 3350-KCL-NA BICARB-NACL 420 G PO SOLR: 4000.0000 mL | Freq: Once | ORAL | 0 refills | Status: AC

## 2020-07-13 ENCOUNTER — Other Ambulatory Visit
Admission: RE | Admit: 2020-07-13 | Discharge: 2020-07-13 | Disposition: A | Discharge status: Home / Self Care | Payer: 59 | Source: Ambulatory Visit | Attending: Gastroenterology | Admitting: Gastroenterology

## 2020-07-13 ENCOUNTER — Other Ambulatory Visit: Payer: Self-pay

## 2020-07-13 DIAGNOSIS — Z20822 Contact with and (suspected) exposure to covid-19: Secondary | ICD-10-CM | POA: Insufficient documentation

## 2020-07-13 DIAGNOSIS — Z01812 Encounter for preprocedural laboratory examination: Secondary | ICD-10-CM | POA: Diagnosis present

## 2020-07-13 LAB — SARS CORONAVIRUS 2 (TAT 6-24 HRS): SARS Coronavirus 2: NEGATIVE

## 2020-07-17 ENCOUNTER — Ambulatory Visit: Payer: 59 | Admitting: Anesthesiology

## 2020-07-17 ENCOUNTER — Encounter
Admission: RE | Disposition: A | Discharge status: Home / Self Care | Payer: Self-pay | Source: Home / Self Care | Attending: Gastroenterology

## 2020-07-17 ENCOUNTER — Encounter: Payer: Self-pay | Admitting: Gastroenterology

## 2020-07-17 ENCOUNTER — Other Ambulatory Visit: Payer: Self-pay

## 2020-07-17 ENCOUNTER — Ambulatory Visit
Admission: RE | Admit: 2020-07-17 | Discharge: 2020-07-17 | Disposition: A | Discharge status: Home / Self Care | Payer: 59 | Attending: Gastroenterology | Admitting: Gastroenterology

## 2020-07-17 DIAGNOSIS — Z8249 Family history of ischemic heart disease and other diseases of the circulatory system: Secondary | ICD-10-CM | POA: Insufficient documentation

## 2020-07-17 DIAGNOSIS — Z87891 Personal history of nicotine dependence: Secondary | ICD-10-CM | POA: Diagnosis not present

## 2020-07-17 DIAGNOSIS — Z1211 Encounter for screening for malignant neoplasm of colon: Secondary | ICD-10-CM

## 2020-07-17 DIAGNOSIS — Z9049 Acquired absence of other specified parts of digestive tract: Secondary | ICD-10-CM | POA: Diagnosis not present

## 2020-07-17 DIAGNOSIS — Z79899 Other long term (current) drug therapy: Secondary | ICD-10-CM | POA: Insufficient documentation

## 2020-07-17 HISTORY — PX: COLONOSCOPY WITH PROPOFOL: SHX5780

## 2020-07-17 LAB — POCT PREGNANCY, URINE: Preg Test, Ur: NEGATIVE

## 2020-07-17 SURGERY — COLONOSCOPY WITH PROPOFOL
Anesthesia: General

## 2020-07-17 MED ORDER — PHENYLEPHRINE HCL (PRESSORS) 10 MG/ML IV SOLN
INTRAVENOUS | Status: DC | PRN
  Administered 2020-07-17 (×3): 50 ug via INTRAVENOUS

## 2020-07-17 MED ORDER — LIDOCAINE HCL (CARDIAC) PF 100 MG/5ML IV SOSY
PREFILLED_SYRINGE | INTRAVENOUS | Status: DC | PRN
  Administered 2020-07-17: 100 mg via INTRAVENOUS

## 2020-07-17 MED ORDER — PROPOFOL 500 MG/50ML IV EMUL
INTRAVENOUS | Status: DC | PRN
  Administered 2020-07-17: 140 ug/kg/min via INTRAVENOUS

## 2020-07-17 MED ORDER — SODIUM CHLORIDE 0.9 % IV SOLN: INTRAVENOUS | Status: DC

## 2020-07-17 MED ORDER — PROPOFOL 10 MG/ML IV BOLUS
INTRAVENOUS | Status: DC | PRN
  Administered 2020-07-17: 50 mg via INTRAVENOUS
  Administered 2020-07-17: 10 mg via INTRAVENOUS

## 2020-07-17 NOTE — H&P (Signed)
Melodie Bouillon, MD [address], [place], [place], [address] [address], [address], [place], [place], [address] Phone: [phone]  Fax: [phone]  Primary Care Physician:  Corky Downs, MD   Pre-Procedure History & Physical: HPI:  Isabel Robinson is a 45 y.o. female is here for a colonoscopy.   Past Medical History:  Diagnosis Date  . Anemia   . Anxiety   . Asthma    CHILDHOOD ASTHMA  . Depression   . Endometriosis   . Fibromyalgia   . GERD (gastroesophageal reflux disease)   . Headache    MIGRAINES  . Heart murmur    ASYMPTOMATIC  . Rash    scalp  . Sepsis ([facility]) 2017    Past Surgical History:  Procedure Laterality Date  . CHOLECYSTECTOMY    . LAPAROSCOPY N/A 09/21/2017   Procedure: LAPAROSCOPY DIAGNOSTIC WITH BIOPSIES;  Surgeon: Herold Harms, MD;  Location: [facility] ORS;  Service: Gynecology;  Laterality: N/A;    Prior to Admission medications   Medication Sig Start Date End Date Taking? Authorizing Provider  Buprenorphine HCl-Naloxone HCl 8-2 MG FILM  06/21/20  Yes [provider]  escitalopram (LEXAPRO) 20 MG tablet Take 20 mg by mouth daily. 04/28/20  Yes [provider]  gabapentin (NEURONTIN) 300 MG capsule TAKE 2 CAPSULES (600 MG TOTAL) BY MOUTH IN THE MORNING, AT NOON, IN THE EVENING, AND AT BEDTIME. 06/15/20  Yes Masoud, Javed, MD  LATUDA 60 MG TABS Take 1 tablet by mouth at bedtime. 04/28/20  Yes [provider]  omeprazole (PRILOSEC) 20 MG capsule Take 20 mg by mouth 2 (two) times daily before a meal.   Yes [provider]  topiramate (TOPAMAX) 25 MG tablet TAKE 1 TABLET BY MOUTH TWICE A DAY 07/02/20  Yes Masoud, Renda Rolls, MD  acetaminophen (TYLENOL 8 HOUR ARTHRITIS PAIN) 650 MG CR tablet Take 1,300 mg by mouth daily.     [provider]  diazepam (VALIUM) 5 MG tablet Take 1 tablet (5 mg total) by mouth 2 (two) times daily. Patient not taking: Reported on 07/04/2020 07/02/20   Corky Downs, MD   ibuprofen (ADVIL,MOTRIN) 200 MG tablet Take 800 mg by mouth 2 (two) times daily as needed (FOR PAIN.).     [provider]  Oxycodone HCl 10 MG TABS Limit 1 tab by mouth 4-6 times per day if tolerated Patient not taking: Reported on 07/04/2020 01/16/16   Ewing Schlein, MD    Allergies as of 07/05/2020  . (No Known Allergies)    Family History  Problem Relation Age of Onset  . Depression Mother   . Arthritis Mother   . Hypertension Mother   . Endometriosis Mother   . Heart disease Father   . Deep vein thrombosis Father   . Hypertension Father   . Diabetes Father   . Ovarian cancer Neg Hx   . Breast cancer Neg Hx     Social History   Socioeconomic History  . Marital status: Married    Spouse name: Not on file  . Number of children: Not on file  . Years of education: Not on file  . Highest education level: Not on file  Occupational History  . Not on file  Tobacco Use  . Smoking status: Former Smoker    Packs/day: 0.50    Years: 10.00    Pack years: 5.00    Types: Cigarettes    Quit date: 09/16/2013    Years since quitting: 6.8  . Smokeless  tobacco: Never Used  Vaping Use  . Vaping Use: Every day  Substance and Sexual Activity  . Alcohol use: Yes    Alcohol/week: 0.0 standard drinks    Comment: RARE  . Drug use: No  . Sexual activity: Not Currently    Birth control/protection: Other-see comments    Comment: vasectomy  Other Topics Concern  . Not on file  Social History Narrative  . Not on file   Social Determinants of Health   Financial Resource Strain: Not on file  Food Insecurity: Not on file  Transportation Needs: Not on file  Physical Activity: Not on file  Stress: Not on file  Social Connections: Not on file  Intimate Partner Violence: Not on file    Review of Systems: See HPI, otherwise negative ROS  Physical Exam: BP 118/69   Pulse 84   Temp (!) 97.5 F (36.4 C) (Temporal)   Resp 18   Ht 5\' 4"  (1.626 m)   Wt 77.1 kg   LMP  08/27/2017 (Exact Date)   SpO2 97%   BMI 29.18 kg/m  General:   Alert,  pleasant and cooperative in NAD Head:  Normocephalic and atraumatic. Neck:  Supple; no masses or thyromegaly. Lungs:  Clear throughout to auscultation, normal respiratory effort.    Heart:  +S1, +S2, Regular rate and rhythm, No edema. Abdomen:  Soft, nontender and nondistended. Normal bowel sounds, without guarding, and without rebound.   Neurologic:  Alert and  oriented x4;  grossly normal neurologically.  Impression/Plan: Isabel Robinson is here for a colonoscopy to be performed for average risk screening.  Risks, benefits, limitations, and alternatives regarding  colonoscopy have been reviewed with the patient.  Questions have been answered.  All parties agreeable.   Gwenlyn Fudge, MD  07/17/2020, 9:02 AM

## 2020-07-17 NOTE — Anesthesia Procedure Notes (Signed)
Procedure Name: MAC Date/Time: 07/17/2020 9:05 AM Performed by: Lily Peer, Ulah Olmo, CRNA Pre-anesthesia Checklist: Patient identified, Emergency Drugs available, Suction available, Timeout performed and Patient being monitored Patient Re-evaluated:Patient Re-evaluated prior to induction Oxygen Delivery Method: Nasal cannula Induction Type: IV induction

## 2020-07-17 NOTE — Progress Notes (Signed)
Risks at this time.

## 2020-07-17 NOTE — Transfer of Care (Signed)
Immediate Anesthesia Transfer of Care Note  Patient: Isabel Robinson  Procedure(s) Performed: COLONOSCOPY WITH PROPOFOL (N/A )  Patient Location: PACU and Endoscopy Unit  Anesthesia Type:General  Level of Consciousness: drowsy  Airway & Oxygen Therapy: Patient Spontanous Breathing  Post-op Assessment: Report given to RN and Post -op Vital signs reviewed and stable  Post vital signs: Reviewed and stable  Last Vitals:  Vitals Value Taken Time  BP 96/50 07/17/20 0944  Temp    Pulse 64 07/17/20 0945  Resp 10 07/17/20 0945  SpO2 98 % 07/17/20 0945  Vitals shown include unvalidated device data.  Last Pain:  Vitals:   07/17/20 0826  TempSrc: Temporal  PainSc: 0-No pain         Complications: No complications documented.

## 2020-07-17 NOTE — Op Note (Signed)
[facility] Gastroenterology Patient Name: Isabel Robinson Procedure Date: 07/17/2020 9:03 AM MRN: [id number] Account #: [id number] Date of Birth: 12-30-1975 Admit Type: Outpatient Age: 45 Room: [facility] ENDO ROOM 3 Gender: Female Note Status: Finalized Procedure:             Colonoscopy Indications:           Screening for colorectal malignant neoplasm Providers:             Taydon Nasworthy B. Maximino Greenland MD, MD Referring MD:          Corky Downs, MD (Referring MD) Medicines:             Monitored Anesthesia Care Complications:         No immediate complications. Procedure:             Pre-Anesthesia Assessment:                        - Prior to the procedure, a History and Physical was                         performed, and patient medications, allergies and                         sensitivities were reviewed. The patient's tolerance                         of previous anesthesia was reviewed.                        - The risks and benefits of the procedure and the                         sedation options and risks were discussed with the                         patient. All questions were answered and informed                         consent was obtained.                        - Patient identification and proposed procedure were                         verified prior to the procedure by the physician, the                         nurse, the anesthetist and the technician. The                         procedure was verified in the pre-procedure area in                         the procedure room in the endoscopy suite.                        - ASA Grade Assessment: II - A patient with mild  systemic disease.                        - After reviewing the risks and benefits, the patient                         was deemed in satisfactory condition to undergo the                         procedure.                        After obtaining informed consent, the  colonoscope was                         passed under direct vision. Throughout the procedure,                         the patient's blood pressure, pulse, and oxygen                         saturations were monitored continuously. The                         Colonoscope was introduced through the anus and                         advanced to the the cecum, identified by appendiceal                         orifice and ileocecal valve. The colonoscopy was                         performed with ease. The patient tolerated the                         procedure well. The quality of the bowel preparation                         was fair. Findings:      The perianal and digital rectal examinations were normal.      The rectum, sigmoid colon, descending colon, transverse colon, ascending       colon and cecum appeared normal.      The retroflexed view of the distal rectum and anal verge was normal and       showed no anal or rectal abnormalities. Impression:            - Preparation of the colon was fair.                        - The rectum, sigmoid colon, descending colon,                         transverse colon, ascending colon and cecum are normal.                        - The distal rectum and anal verge are normal on  retroflexion view.                        - No specimens collected. Recommendation:        - Discharge patient to home.                        - Resume previous diet.                        - Continue present medications.                        - Repeat colonoscopy in 3 years, with 2 day prep. (The                         right colon prep was worse than the left).                        - Return to primary care physician as previously                         scheduled.                        - The findings and recommendations were discussed with                         the patient.                        - The findings and recommendations were  discussed with                         the patient's family. Procedure Code(s):     --- Professional ---                        [phone], Colonoscopy, flexible; diagnostic, including                         collection of specimen(s) by brushing or washing, when                         performed (separate procedure) Diagnosis Code(s):     --- Professional ---                        Z12.11, Encounter for screening for malignant neoplasm                         of colon CPT copyright 2019 American Medical Association. All rights reserved. The codes documented in this report are preliminary and upon coder review may  be revised to meet current compliance requirements.  Melodie Bouillon, MD Michel Bickers B. Maximino Greenland MD, MD 07/17/2020 9:49:26 AM This report has been signed electronically. Number of Addenda: 0 Note Initiated On: 07/17/2020 9:03 AM Scope Withdrawal Time: 0 hours 20 minutes 54 seconds  Total Procedure Duration: 0 hours 32 minutes 40 seconds       [facility] [place]

## 2020-07-17 NOTE — Anesthesia Postprocedure Evaluation (Signed)
Anesthesia Post Note  Patient: Isabel Robinson  Procedure(s) Performed: COLONOSCOPY WITH PROPOFOL (N/A )  Patient location during evaluation: Endoscopy Anesthesia Type: General Level of consciousness: awake and alert Pain management: pain level controlled Vital Signs Assessment: post-procedure vital signs reviewed and stable Respiratory status: spontaneous breathing, nonlabored ventilation, respiratory function stable and patient connected to nasal cannula oxygen Cardiovascular status: blood pressure returned to baseline and stable Postop Assessment: no apparent nausea or vomiting Anesthetic complications: no   No complications documented.   Last Vitals:  Vitals:   07/17/20 1004 07/17/20 1014  BP: 92/62 93/63  Pulse:    Resp:    Temp:    SpO2:      Last Pain:  Vitals:   07/17/20 1014  TempSrc:   PainSc: 0-No pain                 Cleda Mccreedy Caralyn Twining

## 2020-07-17 NOTE — Anesthesia Preprocedure Evaluation (Signed)
Anesthesia Evaluation  Patient identified by MRN, date of birth, ID band Patient awake    Reviewed: Allergy & Precautions, H&P , NPO status , Patient's Chart, lab work & pertinent test results  History of Anesthesia Complications Negative for: history of anesthetic complications  Airway Mallampati: III  TM Distance: >3 FB Neck ROM: full    Dental  (+) Chipped, Upper Dentures   Pulmonary asthma , former smoker,    Pulmonary exam normal        Cardiovascular Exercise Tolerance: Good (-) angina(-) Past MI Normal cardiovascular exam(-) dysrhythmias      Neuro/Psych  Headaches, PSYCHIATRIC DISORDERS  Neuromuscular disease negative psych ROS   GI/Hepatic Neg liver ROS, GERD  Medicated and Controlled,  Endo/Other  negative endocrine ROS  Renal/GU negative Renal ROS  negative genitourinary   Musculoskeletal  (+) Fibromyalgia -  Abdominal   Peds  Hematology negative hematology ROS (+)   Anesthesia Other Findings Past Medical History: No date: Anemia No date: Anxiety No date: Asthma     Comment:  CHILDHOOD ASTHMA No date: Depression No date: Endometriosis No date: Fibromyalgia No date: GERD (gastroesophageal reflux disease) No date: Headache     Comment:  MIGRAINES No date: Heart murmur     Comment:  ASYMPTOMATIC No date: Rash     Comment:  scalp 2017: Sepsis (HCC)  Past Surgical History: No date: CHOLECYSTECTOMY 09/21/2017: LAPAROSCOPY; N/A     Comment:  Procedure: LAPAROSCOPY DIAGNOSTIC WITH BIOPSIES;                Surgeon: Herold Harms, MD;  Location: [facility];               Service: Gynecology;  Laterality: N/A;     Reproductive/Obstetrics negative OB ROS                             Anesthesia Physical Anesthesia Plan  ASA: III  Anesthesia Plan: General   Post-op Pain Management:    Induction: Intravenous  PONV Risk Score and Plan: Propofol infusion and  TIVA  Airway Management Planned: Natural Airway and Nasal Cannula  Additional Equipment:   Intra-op Plan:   Post-operative Plan:   Informed Consent: I have reviewed the patients History and Physical, chart, labs and discussed the procedure including the risks, benefits and alternatives for the proposed anesthesia with the patient or authorized representative who has indicated his/her understanding and acceptance.     Dental Advisory Given  Plan Discussed with: Anesthesiologist, CRNA and Surgeon  Anesthesia Plan Comments: (Patient consented for risks of anesthesia including but not limited to:  - adverse reactions to medications - risk of airway placement if required - damage to eyes, teeth, lips or other oral mucosa - nerve damage due to positioning  - sore throat or hoarseness - Damage to heart, brain, nerves, lungs, other parts of body or loss of life  Patient voiced understanding.)        Anesthesia Quick Evaluation

## 2020-07-19 ENCOUNTER — Encounter: Payer: Self-pay | Admitting: Gastroenterology

## 2020-09-11 ENCOUNTER — Other Ambulatory Visit: Payer: Self-pay | Admitting: Internal Medicine

## 2020-10-04 ENCOUNTER — Other Ambulatory Visit: Payer: Self-pay | Admitting: Internal Medicine

## 2020-12-12 ENCOUNTER — Encounter: Payer: Self-pay | Admitting: Internal Medicine

## 2020-12-12 ENCOUNTER — Ambulatory Visit: Payer: 59 | Admitting: Internal Medicine

## 2020-12-12 ENCOUNTER — Other Ambulatory Visit: Payer: Self-pay

## 2020-12-12 VITALS — BP 124/89 | HR 87 | Ht 64.0 in | Wt 166.2 lb

## 2020-12-12 DIAGNOSIS — G5603 Carpal tunnel syndrome, bilateral upper limbs: Secondary | ICD-10-CM | POA: Diagnosis not present

## 2020-12-12 DIAGNOSIS — M533 Sacrococcygeal disorders, not elsewhere classified: Secondary | ICD-10-CM | POA: Diagnosis not present

## 2020-12-12 DIAGNOSIS — F419 Anxiety disorder, unspecified: Secondary | ICD-10-CM

## 2020-12-12 DIAGNOSIS — G43709 Chronic migraine without aura, not intractable, without status migrainosus: Secondary | ICD-10-CM

## 2020-12-12 DIAGNOSIS — G609 Hereditary and idiopathic neuropathy, unspecified: Secondary | ICD-10-CM

## 2020-12-12 MED ORDER — ESCITALOPRAM OXALATE 20 MG PO TABS: 20.0000 mg | ORAL_TABLET | Freq: Every day | ORAL | 1 refills | Status: DC

## 2020-12-12 MED ORDER — TOPIRAMATE 25 MG PO TABS: 25.0000 mg | ORAL_TABLET | Freq: Two times a day (BID) | ORAL | 1 refills | Status: DC

## 2020-12-12 MED ORDER — BUPROPION HCL ER (XL) 300 MG PO TB24: 300.0000 mg | ORAL_TABLET | Freq: Every day | ORAL | 3 refills | Status: DC

## 2020-12-12 MED ORDER — GABAPENTIN 300 MG PO CAPS: 600.0000 mg | ORAL_CAPSULE | Freq: Four times a day (QID) | ORAL | 3 refills | Status: DC

## 2020-12-12 NOTE — Assessment & Plan Note (Signed)
Chronic problem under control

## 2020-12-12 NOTE — Assessment & Plan Note (Signed)
Stable at the present time. 

## 2020-12-12 NOTE — Progress Notes (Signed)
Established Patient Office Visit  Subjective:  Patient ID: Isabel Robinson, female    DOB: Nov 17, 1975  Age: 45 y.o. MRN: [id number]  CC:  Chief Complaint  Patient presents with   Follow-up    HPI  Isabel Robinson presents forgeneral check up    Past Medical History:  Diagnosis Date   Anemia    Anxiety    Asthma    CHILDHOOD ASTHMA   Depression    Endometriosis    Fibromyalgia    GERD (gastroesophageal reflux disease)    Headache    MIGRAINES   Heart murmur    ASYMPTOMATIC   Rash    scalp   Sepsis (HCC) 2017    Past Surgical History:  Procedure Laterality Date   CHOLECYSTECTOMY     COLONOSCOPY WITH PROPOFOL N/A 07/17/2020   Procedure: COLONOSCOPY WITH PROPOFOL;  Surgeon: Pasty Spillers, MD;  Location: [facility];  Service: Endoscopy;  Laterality: N/A;   LAPAROSCOPY N/A 09/21/2017   Procedure: LAPAROSCOPY DIAGNOSTIC WITH BIOPSIES;  Surgeon: Herold Harms, MD;  Location: [facility];  Service: Gynecology;  Laterality: N/A;    Family History  Problem Relation Age of Onset   Depression Mother    Arthritis Mother    Hypertension Mother    Endometriosis Mother    Heart disease Father    Deep vein thrombosis Father    Hypertension Father    Diabetes Father    Ovarian cancer Neg Hx    Breast cancer Neg Hx     Social History   Socioeconomic History   Marital status: Married    Spouse name: Not on file   Number of children: Not on file   Years of education: Not on file   Highest education level: Not on file  Occupational History   Not on file  Tobacco Use   Smoking status: Former    Packs/day: 0.50    Years: 10.00    Pack years: 5.00    Types: Cigarettes    Quit date: 09/16/2013    Years since quitting: 7.2   Smokeless tobacco: Never  Vaping Use   Vaping Use: Every day  Substance and Sexual Activity   Alcohol use: Yes    Alcohol/week: 0.0 standard drinks    Comment: RARE   Drug use: No   Sexual activity: Not Currently    Birth  control/protection: Other-see comments    Comment: vasectomy  Other Topics Concern   Not on file  Social History Narrative   Not on file   Social Determinants of Health   Financial Resource Strain: Not on file  Food Insecurity: Not on file  Transportation Needs: Not on file  Physical Activity: Not on file  Stress: Not on file  Social Connections: Not on file  Intimate Partner Violence: Not on file     Current Outpatient Medications:    acetaminophen (TYLENOL 8 HOUR ARTHRITIS PAIN) 650 MG CR tablet, Take 1,300 mg by mouth daily. , Disp: , Rfl:    Buprenorphine HCl-Naloxone HCl 8-2 MG FILM, , Disp: , Rfl:    ibuprofen (ADVIL,MOTRIN) 200 MG tablet, Take 800 mg by mouth 2 (two) times daily as needed (FOR PAIN.). , Disp: , Rfl:    omeprazole (PRILOSEC) 20 MG capsule, Take 20 mg by mouth 2 (two) times daily before a meal., Disp: , Rfl:    buPROPion (WELLBUTRIN XL) 300 MG 24 hr tablet, Take 1 tablet (300 mg total) by mouth daily., Disp: 90 tablet, Rfl: 3  escitalopram (LEXAPRO) 20 MG tablet, Take 1 tablet (20 mg total) by mouth daily., Disp: 90 tablet, Rfl: 1   gabapentin (NEURONTIN) 300 MG capsule, Take 2 capsules (600 mg total) by mouth in the morning, at noon, in the evening, and at bedtime., Disp: 360 capsule, Rfl: 3   topiramate (TOPAMAX) 25 MG tablet, Take 1 tablet (25 mg total) by mouth 2 (two) times daily., Disp: 180 tablet, Rfl: 1   No Known Allergies  ROS Review of Systems  Constitutional: Negative.   HENT: Negative.  Negative for rhinorrhea.   Eyes: Negative.   Respiratory: Negative.  Negative for chest tightness.   Cardiovascular: Negative.   Gastrointestinal: Negative.   Endocrine: Negative.        Postmenopausal symptoms  Genitourinary: Negative.  Negative for hematuria.  Musculoskeletal:  Positive for arthralgias.  Skin: Negative.   Allergic/Immunologic: Negative.   Neurological: Negative.   Hematological: Negative.   Psychiatric/Behavioral: Negative.  Negative  for agitation, confusion and dysphoric mood.   All other systems reviewed and are negative.    Objective:    Physical Exam Vitals reviewed.  Constitutional:      Appearance: Normal appearance.  HENT:     Mouth/Throat:     Mouth: Mucous membranes are moist.  Eyes:     Pupils: Pupils are equal, round, and reactive to light.  Neck:     Vascular: No carotid bruit.  Cardiovascular:     Rate and Rhythm: Normal rate and regular rhythm.     Pulses: Normal pulses.     Heart sounds: Normal heart sounds.  Pulmonary:     Effort: Pulmonary effort is normal.     Breath sounds: Normal breath sounds.  Abdominal:     General: Bowel sounds are normal.     Palpations: Abdomen is soft. There is no hepatomegaly, splenomegaly or mass.     Tenderness: There is no abdominal tenderness.     Hernia: No hernia is present.  Musculoskeletal:        General: No tenderness.     Cervical back: Neck supple.     Right lower leg: No edema.     Left lower leg: No edema.  Skin:    Findings: No rash.  Neurological:     Mental Status: She is alert and oriented to person, place, and time.     Motor: No weakness.  Psychiatric:        Mood and Affect: Mood and affect normal.        Behavior: Behavior normal.    BP 124/89   Pulse 87   Ht 5\' 4"  (1.626 m)   Wt 166 lb 3.2 oz (75.4 kg)   LMP 08/27/2017 (Exact Date)   BMI 28.53 kg/m  Wt Readings from Last 3 Encounters:  12/12/20 166 lb 3.2 oz (75.4 kg)  07/17/20 170 lb (77.1 kg)  05/21/20 163 lb 4.8 oz (74.1 kg)     Health Maintenance Due  Topic Date Due   COVID-19 Vaccine (1) Never done   HIV Screening  Never done   Hepatitis C Screening  Never done   TETANUS/TDAP  Never done   PAP SMEAR-Modifier  Never done    There are no preventive care reminders to display for this patient.  No results found for: TSH Lab Results  Component Value Date   WBC 5.2 09/18/2017   HGB 11.9 (L) 09/18/2017   HCT 35.8 09/18/2017   MCV 86.1 09/18/2017   PLT 326  09/18/2017   Lab  Results  Component Value Date   NA 140 02/17/2014   K 3.2 (L) 02/17/2014   CO2 24 02/17/2014   GLUCOSE 114 (H) 02/17/2014   BUN 10 02/17/2014   CREATININE 1.02 02/17/2014   BILITOT 0.3 02/16/2014   ALKPHOS 189 (H) 02/16/2014   AST 22 02/16/2014   ALT 21 02/16/2014   PROT 7.0 02/16/2014   ALBUMIN 2.8 (L) 02/16/2014   CALCIUM 7.6 (L) 02/17/2014   ANIONGAP 6 (L) 02/17/2014   No results found for: CHOL No results found for: HDL No results found for: LDLCALC No results found for: TRIG No results found for: CHOLHDL No results found for: [id number]    Assessment & Plan:   Problem List Items Addressed This Visit       Cardiovascular and Mediastinum   Migraine - Primary    Stable at the present time       Relevant Medications   escitalopram (LEXAPRO) 20 MG tablet   buPROPion (WELLBUTRIN XL) 300 MG 24 hr tablet   topiramate (TOPAMAX) 25 MG tablet   gabapentin (NEURONTIN) 300 MG capsule     Nervous and Auditory   Carpal tunnel syndrome    Stable at the present time       Relevant Medications   escitalopram (LEXAPRO) 20 MG tablet   buPROPion (WELLBUTRIN XL) 300 MG 24 hr tablet   topiramate (TOPAMAX) 25 MG tablet   gabapentin (NEURONTIN) 300 MG capsule   Hereditary and idiopathic neuropathy    Chronic problem under control       Relevant Medications   escitalopram (LEXAPRO) 20 MG tablet   buPROPion (WELLBUTRIN XL) 300 MG 24 hr tablet   topiramate (TOPAMAX) 25 MG tablet   gabapentin (NEURONTIN) 300 MG capsule     Musculoskeletal and Integument   Sacroiliac joint dysfunction    - Patient's back pain is under control with medication.  - Encouraged the patient to stretch or do yoga as able to help with back pain         Other   Anxiety    - Patient experiencing high levels of anxiety.  - Encouraged patient to engage in relaxing activities like yoga, meditation, journaling, going for a walk, or participating in a hobby.  - Encouraged patient to  reach out to trusted friends or family members about recent struggles, Patient was advised to read A book, how to stop worrying and start living, it is good book to read to control  the stress        Relevant Medications   escitalopram (LEXAPRO) 20 MG tablet   buPROPion (WELLBUTRIN XL) 300 MG 24 hr tablet   Meds ordered this encounter  Medications   escitalopram (LEXAPRO) 20 MG tablet    Sig: Take 1 tablet (20 mg total) by mouth daily.    Dispense:  90 tablet    Refill:  1   buPROPion (WELLBUTRIN XL) 300 MG 24 hr tablet    Sig: Take 1 tablet (300 mg total) by mouth daily.    Dispense:  90 tablet    Refill:  3   topiramate (TOPAMAX) 25 MG tablet    Sig: Take 1 tablet (25 mg total) by mouth 2 (two) times daily.    Dispense:  180 tablet    Refill:  1   gabapentin (NEURONTIN) 300 MG capsule    Sig: Take 2 capsules (600 mg total) by mouth in the morning, at noon, in the evening, and at bedtime.    Dispense:  360 capsule    Refill:  3     Follow-up: No follow-ups on file.    Corky Downs, MD

## 2020-12-12 NOTE — Assessment & Plan Note (Signed)
-   Patient's back pain is under control with medication.  - Encouraged the patient to stretch or do yoga as able to help with back pain 

## 2020-12-12 NOTE — Assessment & Plan Note (Signed)
-   Patient experiencing high levels of anxiety.  - Encouraged patient to engage in relaxing activities like yoga, meditation, journaling, going for a walk, or participating in a hobby.  - Encouraged patient to reach out to trusted friends or family members about recent struggles, Patient was advised to read A book, how to stop worrying and start living, it is good book to read to control  the stress  

## 2021-03-13 ENCOUNTER — Ambulatory Visit: Payer: 59 | Admitting: Internal Medicine

## 2021-06-06 ENCOUNTER — Other Ambulatory Visit: Payer: Self-pay | Admitting: Internal Medicine

## 2021-06-08 ENCOUNTER — Other Ambulatory Visit: Payer: Self-pay | Admitting: Internal Medicine

## 2021-08-19 DIAGNOSIS — Z1211 Encounter for screening for malignant neoplasm of colon: Secondary | ICD-10-CM

## 2021-09-05 ENCOUNTER — Encounter: Payer: Self-pay | Admitting: Internal Medicine

## 2021-09-06 ENCOUNTER — Ambulatory Visit
Admission: RE | Admit: 2021-09-06 | Discharge: 2021-09-06 | Disposition: A | Discharge status: Home / Self Care | Payer: 59 | Source: Ambulatory Visit | Attending: Emergency Medicine | Admitting: Emergency Medicine

## 2021-09-06 ENCOUNTER — Ambulatory Visit
Admission: EM | Admit: 2021-09-06 | Discharge: 2021-09-06 | Disposition: A | Discharge status: Home / Self Care | Payer: 59

## 2021-09-06 ENCOUNTER — Encounter: Payer: Self-pay | Admitting: Emergency Medicine

## 2021-09-06 DIAGNOSIS — M7989 Other specified soft tissue disorders: Secondary | ICD-10-CM | POA: Insufficient documentation

## 2021-09-06 DIAGNOSIS — M79662 Pain in left lower leg: Secondary | ICD-10-CM

## 2021-09-06 HISTORY — DX: Varicose veins of bilateral lower extremities with pain: I83.813

## 2021-09-06 NOTE — ED Triage Notes (Signed)
Patient c/o LFT lower leg pain x 1 week.  ? ?Patient endorses worsening pain, that is worse at night.  ? ?Patient endorses pain behind knee upon waking this morning.  ? ?Patient endorses leg swelling.  ? ?Patient has used compression socks with no relief of symptoms.  ? ?History of varicose veins.  ?

## 2021-09-06 NOTE — Discharge Instructions (Addendum)
Go to [facility] for ultrasound now. ?

## 2021-09-06 NOTE — ED Provider Notes (Signed)
?[facility] ? ? ? ?CSN: [id number] ?Arrival date & time: 09/06/21  1228 ? ? ?  ? ?History   ?Chief Complaint ?Chief Complaint  ?Patient presents with  ? Leg Pain  ? ? ?HPI ?Isabel Robinson is a 46 y.o. female.  Patient presents with pain and swelling of her left lower leg x1 week.  She is concerned for possible blood clot.  She reports new varicose veins on her left lower leg at the site of her pain and swelling.  No known injury.  No weakness, numbness, unusual paresthesias, open wounds, redness, chest pain, shortness of breath, or other symptoms.  No treatments at home.  She contacted her PCP yesterday and has an appointment scheduled on Monday.  Her medical history includes bursitis, carpal tunnel syndrome, neuropathy, anxiety, depression, fibromyalgia, headaches, GERD, asthma in childhood. ? ?The history is provided by the patient and medical records.  ? ?Past Medical History:  ?Diagnosis Date  ? Anemia   ? Anxiety   ? Asthma   ? CHILDHOOD ASTHMA  ? Depression   ? Endometriosis   ? Fibromyalgia   ? GERD (gastroesophageal reflux disease)   ? Headache   ? MIGRAINES  ? Heart murmur   ? ASYMPTOMATIC  ? Rash   ? scalp  ? Sepsis (HCC) 2017  ? Varicose veins of leg with pain, bilateral   ? ? ?Patient Active Problem List  ? Diagnosis Date Noted  ? Encounter for screening colonoscopy   ? Anxiety 07/04/2020  ? Depression 07/04/2020  ? Headache(784.0) 07/04/2020  ? PMB (postmenopausal bleeding) 07/04/2020  ? Amenorrhea, unspecified 03/29/2020  ? Endometriosis 10/01/2017  ? History of cryosurgery 08/27/2017  ? Cervical stenosis (uterine cervix) 08/27/2017  ? Family history of endometriosis in first degree relative 08/27/2017  ? Pelvic pain in female 08/27/2017  ? Absolute anemia 04/02/2015  ? Carpal tunnel syndrome 04/02/2015  ? Headache 04/02/2015  ? Hereditary and idiopathic neuropathy 04/02/2015  ? Dysmenorrhea 04/02/2015  ? Menorrhagia 04/02/2015  ? History of cervical dysplasia 04/02/2015  ? Greater  trochanteric bursitis 10/31/2014  ? Migraine 10/02/2014  ? Sacroiliac joint dysfunction 10/02/2014  ? Bilateral occipital neuralgia 10/02/2014  ? ? ?Past Surgical History:  ?Procedure Laterality Date  ? CHOLECYSTECTOMY    ? COLONOSCOPY WITH PROPOFOL N/A 07/17/2020  ? Procedure: COLONOSCOPY WITH PROPOFOL;  Surgeon: Pasty Spillersahiliani, Varnita B, MD;  Location: [facility];  Service: Endoscopy;  Laterality: N/A;  ? LAPAROSCOPY N/A 09/21/2017  ? Procedure: LAPAROSCOPY DIAGNOSTIC WITH BIOPSIES;  Surgeon: Herold Harmsefrancesco, Weill A, MD;  Location: [facility];  Service: Gynecology;  Laterality: N/A;  ? ? ?OB History   ? ? Gravida  ?1  ? Para  ?1  ? Term  ?1  ? Preterm  ?   ? AB  ?   ? Living  ?1  ?  ? ? SAB  ?   ? IAB  ?   ? Ectopic  ?   ? Multiple  ?   ? Live Births  ?1  ?   ?  ?  ? ? ? ?Home Medications   ? ?Prior to Admission medications   ?Medication Sig Start Date End Date Taking? Authorizing Provider  ?acetaminophen (TYLENOL 8 HOUR ARTHRITIS PAIN) 650 MG CR tablet Take 1,300 mg by mouth daily.    Yes [provider]  ?amphetamine-dextroamphetamine (ADDERALL) 30 MG tablet Take 1 tablet by mouth 2 (two) times daily. 09/05/21  Yes [provider]  ?Buprenorphine HCl-Naloxone HCl 8-2 MG FILM  06/21/20  Yes [provider]  ?buPROPion (WELLBUTRIN XL) 300 MG 24 hr tablet Take 1 tablet (300 mg total) by mouth daily. 12/12/20  Yes Masoud, Renda Rolls, MD  ?escitalopram (LEXAPRO) 20 MG tablet TAKE 1 TABLET BY MOUTH EVERY DAY 06/10/21  Yes Masoud, Renda Rolls, MD  ?gabapentin (NEURONTIN) 300 MG capsule Take 2 capsules (600 mg total) by mouth in the morning, at noon, in the evening, and at bedtime. 12/12/20  Yes Masoud, Renda Rolls, MD  ?ibuprofen (ADVIL,MOTRIN) 200 MG tablet Take 800 mg by mouth 2 (two) times daily as needed (FOR PAIN.).    Yes [provider]  ?omeprazole (PRILOSEC) 20 MG capsule Take 20 mg by mouth 2 (two) times daily before a meal.   Yes [provider]  ?topiramate (TOPAMAX) 25 MG tablet TAKE 1 TABLET  BY MOUTH TWICE A DAY 06/07/21  Yes Corky Downs, MD  ? ? ?Family History ?Family History  ?Problem Relation Age of Onset  ? Depression Mother   ? Arthritis Mother   ? Hypertension Mother   ? Endometriosis Mother   ? Heart disease Father   ? Deep vein thrombosis Father   ? Hypertension Father   ? Diabetes Father   ? Ovarian cancer Neg Hx   ? Breast cancer Neg Hx   ? ? ?Social History ?Social History  ? ?Tobacco Use  ? Smoking status: Former  ?  Packs/day: 0.50  ?  Years: 10.00  ?  Pack years: 5.00  ?  Types: Cigarettes  ?  Quit date: 09/16/2013  ?  Years since quitting: 7.9  ? Smokeless tobacco: Never  ?Vaping Use  ? Vaping Use: Every day  ?Substance Use Topics  ? Alcohol use: Yes  ?  Alcohol/week: 0.0 standard drinks  ?  Comment: RARE  ? Drug use: No  ? ? ? ?Allergies   ?Patient has no known allergies. ? ? ?Review of Systems ?Review of Systems  ?Constitutional:  Negative for chills and fever.  ?Respiratory:  Negative for cough and shortness of breath.   ?Cardiovascular:  Negative for chest pain and palpitations.  ?Musculoskeletal:  Negative for arthralgias, gait problem and joint swelling.  ?Skin:  Negative for color change and rash.  ?     New painful swollen varicose veins in LLE.   ?Neurological:  Negative for weakness and numbness.  ?All other systems reviewed and are negative. ? ? ?Physical Exam ?Triage Vital Signs ?ED Triage Vitals  ?Enc Vitals Group  ?   BP   ?   Pulse   ?   Resp   ?   Temp   ?   Temp src   ?   SpO2   ?   Weight   ?   Height   ?   Head Circumference   ?   Peak Flow   ?   Pain Score   ?   Pain Loc   ?   Pain Edu?   ?   Excl. in GC?   ? ?No data found. ? ?Updated Vital Signs ?BP 139/75   Pulse (!) 113   Temp 98.1 ?F (36.7 ?C)   Resp 18   LMP 08/27/2017 (Exact Date)   SpO2 98%  ? ?Visual Acuity ?Right Eye Distance:   ?Left Eye Distance:   ?Bilateral Distance:   ? ?Right Eye Near:   ?Left Eye Near:    ?Bilateral Near:    ? ?Physical Exam ?Vitals and nursing note reviewed.  ?Constitutional:   ?  General: She is not in acute distress. ?   Appearance: Normal appearance. She is well-developed. She is not ill-appearing.  ?HENT:  ?   Mouth/Throat:  ?   Mouth: Mucous membranes are moist.  ?Cardiovascular:  ?   Rate and Rhythm: Normal rate and regular rhythm.  ?   Heart sounds: Normal heart sounds.  ?Pulmonary:  ?   Effort: Pulmonary effort is normal. No respiratory distress.  ?   Breath sounds: Normal breath sounds.  ?Musculoskeletal:     ?   General: No swelling or deformity. Normal range of motion.  ?   Cervical back: Neck supple.  ?Skin: ?   General: Skin is warm and dry.  ?   Capillary Refill: Capillary refill takes less than 2 seconds.  ?   Comments: Scattered varicose veins on lower extremities.  Tender enlarged varicose veins on anterior LLE.  ?Neurological:  ?   General: No focal deficit present.  ?   Mental Status: She is alert and oriented to person, place, and time.  ?   Sensory: No sensory deficit.  ?   Motor: No weakness.  ?   Gait: Gait normal.  ?Psychiatric:     ?   Mood and Affect: Mood normal.     ?   Behavior: Behavior normal.  ? ? ? ?UC Treatments / Results  ?Labs ?(all labs ordered are listed, but only abnormal results are displayed) ?Labs Reviewed - No data to display ? ?EKG ? ? ?Radiology ?US Venous Img Lower Unilateral Left (DVT) ? ?Result Date: 09/06/2021 ?CLINICAL DATA:  Pain and swelling EXAM: LEFT LOWER EXTREMITY VENOUS DOPPLER ULTRASOUND TECHNIQUE: Gray-scale sonography with compression, as well as color and duplex ultrasound, were performed to evaluate the deep venous system(s) from the level of the common femoral vein through the popliteal and proximal calf veins. COMPARISON:  None. FINDINGS: VENOUS Normal compressibility of the common femoral, superficial femoral, and popliteal veins, as well as the visualized calf veins. Visualized portions of profunda femoral vein and great saphenous vein unremarkable. No filling defects to suggest DVT on grayscale or color Doppler imaging.  Doppler waveforms show normal direction of venous flow, normal respiratory plasticity and response to augmentation. Limited views of the contralateral common femoral vein are unremarkable. OTHER None. Limitations: none

## 2021-09-09 ENCOUNTER — Encounter: Payer: Self-pay | Admitting: Internal Medicine

## 2021-09-09 ENCOUNTER — Ambulatory Visit: Payer: 59 | Admitting: Internal Medicine

## 2021-09-09 VITALS — BP 143/73 | HR 97 | Ht 64.0 in | Wt 170.5 lb

## 2021-09-09 DIAGNOSIS — F419 Anxiety disorder, unspecified: Secondary | ICD-10-CM | POA: Diagnosis not present

## 2021-09-09 DIAGNOSIS — G43709 Chronic migraine without aura, not intractable, without status migrainosus: Secondary | ICD-10-CM | POA: Diagnosis not present

## 2021-09-09 DIAGNOSIS — M7062 Trochanteric bursitis, left hip: Secondary | ICD-10-CM

## 2021-09-09 DIAGNOSIS — I8393 Asymptomatic varicose veins of bilateral lower extremities: Secondary | ICD-10-CM | POA: Diagnosis not present

## 2021-09-09 NOTE — Assessment & Plan Note (Signed)
Patient is a bursitis of the left hip on the greater trochanter ?Patient has a swelling of the left leg, negative for DVT will be referred to vascular, might need ?Ref to neurology ?

## 2021-09-09 NOTE — Assessment & Plan Note (Signed)
Stable at the present time. 

## 2021-09-09 NOTE — Assessment & Plan Note (Signed)
-   Patient experiencing high levels of anxiety.  - Encouraged patient to engage in relaxing activities like yoga, meditation, journaling, going for a walk, or participating in a hobby.  - Encouraged patient to reach out to trusted friends or family members about recent struggles, Patient was advised to read A book, how to stop worrying and start living, it is good book to read to control  the stress  

## 2021-09-09 NOTE — Progress Notes (Signed)
? ?Established Patient Office Visit ? ?Subjective:  ?Patient ID: Isabel Robinson, female    DOB: 1975-11-15  Age: 46 y.o. MRN: [id number] ? ?CC:  ?Chief Complaint  ?Patient presents with  ? Follow-up  ?  Patient here today for ED follow up. Patient had concern of possible DVT and an ultrasound was performed. Patient was neg for DVT.   ? ? ?- Patient experiencing high levels of anxiety.  ?- Encouraged patient to engage in relaxing activities like yoga, meditation, journaling, going for a walk, or participating in a hobby.  ?- Encouraged patient to reach out to trusted friends or family members about recent struggles  ? ?Leg Pain  ?The pain is present in the left hip. The quality of the pain is described as burning and cramping. The pain is at a severity of 5/10. The pain is moderate. Pertinent negatives include no inability to bear weight, loss of motion, loss of sensation or muscle weakness.  ? ?Isabel Robinson presents for check up ? ?Past Medical History:  ?Diagnosis Date  ? Anemia   ? Anxiety   ? Asthma   ? CHILDHOOD ASTHMA  ? Depression   ? Endometriosis   ? Fibromyalgia   ? GERD (gastroesophageal reflux disease)   ? Headache   ? MIGRAINES  ? Heart murmur   ? ASYMPTOMATIC  ? Rash   ? scalp  ? Sepsis (HCC) 2017  ? Varicose veins of leg with pain, bilateral   ? ? ?Past Surgical History:  ?Procedure Laterality Date  ? CHOLECYSTECTOMY    ? COLONOSCOPY WITH PROPOFOL N/A 07/17/2020  ? Procedure: COLONOSCOPY WITH PROPOFOL;  Surgeon: Pasty Spillers, MD;  Location: [facility];  Service: Endoscopy;  Laterality: N/A;  ? LAPAROSCOPY N/A 09/21/2017  ? Procedure: LAPAROSCOPY DIAGNOSTIC WITH BIOPSIES;  Surgeon: Herold Harms, MD;  Location: [facility];  Service: Gynecology;  Laterality: N/A;  ? ? ?Family History  ?Problem Relation Age of Onset  ? Depression Mother   ? Arthritis Mother   ? Hypertension Mother   ? Endometriosis Mother   ? Heart disease Father   ? Deep vein thrombosis Father   ? Hypertension Father   ?  Diabetes Father   ? Ovarian cancer Neg Hx   ? Breast cancer Neg Hx   ? ? ?Social History  ? ?Socioeconomic History  ? Marital status: Married  ?  Spouse name: Not on file  ? Number of children: Not on file  ? Years of education: Not on file  ? Highest education level: Not on file  ?Occupational History  ? Not on file  ?Tobacco Use  ? Smoking status: Former  ?  Packs/day: 0.50  ?  Years: 10.00  ?  Pack years: 5.00  ?  Types: Cigarettes  ?  Quit date: 09/16/2013  ?  Years since quitting: 7.9  ? Smokeless tobacco: Never  ?Vaping Use  ? Vaping Use: Every day  ?Substance and Sexual Activity  ? Alcohol use: Yes  ?  Alcohol/week: 0.0 standard drinks  ?  Comment: RARE  ? Drug use: No  ? Sexual activity: Not Currently  ?  Birth control/protection: Other-see comments  ?  Comment: vasectomy  ?Other Topics Concern  ? Not on file  ?Social History Narrative  ? Not on file  ? ?Social Determinants of Health  ? ?Financial Resource Strain: Not on file  ?Food Insecurity: Not on file  ?Transportation Needs: Not on file  ?Physical Activity: Not on file  ?  Stress: Not on file  ?Social Connections: Not on file  ?Intimate Partner Violence: Not on file  ? ? ? ?Current Outpatient Medications:  ?  acetaminophen (TYLENOL 8 HOUR ARTHRITIS PAIN) 650 MG CR tablet, Take 1,300 mg by mouth daily. , Disp: , Rfl:  ?  amphetamine-dextroamphetamine (ADDERALL) 30 MG tablet, Take 1 tablet by mouth 2 (two) times daily., Disp: , Rfl:  ?  Buprenorphine HCl-Naloxone HCl 8-2 MG FILM, , Disp: , Rfl:  ?  buPROPion (WELLBUTRIN XL) 300 MG 24 hr tablet, Take 1 tablet (300 mg total) by mouth daily., Disp: 90 tablet, Rfl: 3 ?  escitalopram (LEXAPRO) 20 MG tablet, TAKE 1 TABLET BY MOUTH EVERY DAY, Disp: 90 tablet, Rfl: 1 ?  gabapentin (NEURONTIN) 300 MG capsule, Take 2 capsules (600 mg total) by mouth in the morning, at noon, in the evening, and at bedtime., Disp: 360 capsule, Rfl: 3 ?  ibuprofen (ADVIL,MOTRIN) 200 MG tablet, Take 800 mg by mouth 2 (two) times daily as  needed (FOR PAIN.). , Disp: , Rfl:  ?  omeprazole (PRILOSEC) 20 MG capsule, Take 20 mg by mouth 2 (two) times daily before a meal., Disp: , Rfl:  ?  topiramate (TOPAMAX) 25 MG tablet, TAKE 1 TABLET BY MOUTH TWICE A DAY, Disp: 180 tablet, Rfl: 1  ? ?No Known Allergies ? ?ROS ?Review of Systems  ?Constitutional: Negative.   ?HENT: Negative.    ?Eyes: Negative.   ?Respiratory: Negative.    ?Cardiovascular: Negative.   ?Gastrointestinal: Negative.   ?Endocrine: Negative.   ?Genitourinary: Negative.   ?Musculoskeletal: Negative.   ?Skin: Negative.   ?Allergic/Immunologic: Negative.   ?Neurological: Negative.   ?Hematological: Negative.  Negative for adenopathy.  ?Psychiatric/Behavioral:  The patient is nervous/anxious.   ?All other systems reviewed and are negative. ? ?  ?Objective:  ?  ?Physical Exam ?Vitals reviewed.  ?Constitutional:   ?   Appearance: Normal appearance.  ?HENT:  ?   Mouth/Throat:  ?   Mouth: Mucous membranes are moist.  ?Eyes:  ?   Pupils: Pupils are equal, round, and reactive to light.  ?Neck:  ?   Vascular: No carotid bruit.  ?Cardiovascular:  ?   Rate and Rhythm: Normal rate and regular rhythm.  ?   Pulses: Normal pulses.  ?   Heart sounds: Normal heart sounds.  ?Pulmonary:  ?   Effort: Pulmonary effort is normal.  ?   Breath sounds: Normal breath sounds.  ?Abdominal:  ?   General: Bowel sounds are normal.  ?   Palpations: Abdomen is soft. There is no hepatomegaly, splenomegaly or mass.  ?   Tenderness: There is no abdominal tenderness.  ?   Hernia: No hernia is present.  ?Musculoskeletal:     ?   General: No tenderness.  ?   Cervical back: Neck supple.  ?   Right lower leg: No edema.  ?   Left lower leg: No edema.  ?   Comments: Left leg is swollen, patient has an ultrasound of the left leg which was negative for blood clot. ?Patient is very tender in the left trochanteric area  ?Skin: ?   Findings: No rash.  ?Neurological:  ?   Mental Status: She is alert and oriented to person, place, and time.   ?   Motor: No weakness.  ?Psychiatric:     ?   Mood and Affect: Mood and affect normal.     ?   Behavior: Behavior normal.  ? ? ?BP (!) 143/73  Pulse 97   Ht 5\' 4"  (1.626 m)   Wt 170 lb 8 oz (77.3 kg)   LMP 08/27/2017 (Exact Date)   BMI 29.27 kg/m?  ?Wt Readings from Last 3 Encounters:  ?09/09/21 170 lb 8 oz (77.3 kg)  ?12/12/20 166 lb 3.2 oz (75.4 kg)  ?07/17/20 170 lb (77.1 kg)  ? ? ? ?Health Maintenance Due  ?Topic Date Due  ? COVID-19 Vaccine (1) Never done  ? HIV Screening  Never done  ? Hepatitis C Screening  Never done  ? TETANUS/TDAP  Never done  ? PAP SMEAR-Modifier  Never done  ? ? ?There are no preventive care reminders to display for this patient. ? ?No results found for: TSH ?Lab Results  ?Component Value Date  ? WBC 5.2 09/18/2017  ? HGB 11.9 (L) 09/18/2017  ? HCT 35.8 09/18/2017  ? MCV 86.1 09/18/2017  ? PLT 326 09/18/2017  ? ?Lab Results  ?Component Value Date  ? NA 140 02/17/2014  ? K 3.2 (L) 02/17/2014  ? CO2 24 02/17/2014  ? GLUCOSE 114 (H) 02/17/2014  ? BUN 10 02/17/2014  ? CREATININE 1.02 02/17/2014  ? BILITOT 0.3 02/16/2014  ? ALKPHOS 189 (H) 02/16/2014  ? AST 22 02/16/2014  ? ALT 21 02/16/2014  ? PROT 7.0 02/16/2014  ? ALBUMIN 2.8 (L) 02/16/2014  ? CALCIUM 7.6 (L) 02/17/2014  ? ANIONGAP 6 (L) 02/17/2014  ? ?No results found for: CHOL ?No results found for: HDL ?No results found for: LDLCALC ?No results found for: TRIG ?No results found for: CHOLHDL ?No results found for: HGBA1C ? ?  ?Assessment & Plan:  ? ?Problem List Items Addressed This Visit   ? ?  ? Cardiovascular and Mediastinum  ? Migraine - Primary  ?  Stable at the present time ? ?  ?  ?  ? Musculoskeletal and Integument  ? Greater trochanteric bursitis  ?  Patient is a bursitis of the left hip on the greater trochanter ?Patient has a swelling of the left leg, negative for DVT will be referred to vascular, might need ?Ref to neurology ? ?  ?  ?  ? Other  ? Anxiety  ?  - Patient experiencing high levels of anxiety.  ?-  Encouraged patient to engage in relaxing activities like yoga, meditation, journaling, going for a walk, or participating in a hobby.  ?- Encouraged patient to reach out to trusted friends or family members about r

## 2021-09-11 NOTE — Addendum Note (Signed)
Addended by: Jobie Quaker on: 09/11/2021 09:42 AM ? ? Modules accepted: Orders ? ?

## 2021-10-08 ENCOUNTER — Encounter (INDEPENDENT_AMBULATORY_CARE_PROVIDER_SITE_OTHER): Payer: Self-pay | Admitting: Nurse Practitioner

## 2021-10-08 ENCOUNTER — Ambulatory Visit (INDEPENDENT_AMBULATORY_CARE_PROVIDER_SITE_OTHER): Payer: 59 | Admitting: Nurse Practitioner

## 2021-10-08 VITALS — BP 154/83 | HR 92 | Resp 16 | Wt 175.8 lb

## 2021-10-08 DIAGNOSIS — M79605 Pain in left leg: Secondary | ICD-10-CM | POA: Diagnosis not present

## 2021-10-08 DIAGNOSIS — M79604 Pain in right leg: Secondary | ICD-10-CM

## 2021-10-10 ENCOUNTER — Encounter (INDEPENDENT_AMBULATORY_CARE_PROVIDER_SITE_OTHER): Payer: Self-pay | Admitting: Nurse Practitioner

## 2021-10-14 NOTE — Progress Notes (Signed)
Subjective:    Patient ID: Isabel Robinson, female    DOB: 09-11-1975, 46 y.o.   MRN: [id number] Chief Complaint  Patient presents with   New Patient (Initial Visit)    Ref Masoud varicose veins with both lower extremities    Isabel Robinson is a 46 year old female that is referred by her PCP in regards to pain in lower extremities.  The pain initially started with the left lower extremity with veins popping up and hurting worse.  She began to have swelling in both legs has been ongoing for the last 2 weeks or so.  She also has notable stasis dermatitis bilaterally.  The patient also has some claudication-like symptoms.  She denies rest pain.  There are no open wounds or ulcerations.  There is no weeping.   Review of Systems  Cardiovascular:  Positive for leg swelling.  All other systems reviewed and are negative.     Objective:   Physical Exam Vitals reviewed.  HENT:     Head: Normocephalic.  Cardiovascular:     Rate and Rhythm: Normal rate.     Pulses:          Dorsalis pedis pulses are 1+ on the right side and 1+ on the left side.  Pulmonary:     Effort: Pulmonary effort is normal.  Musculoskeletal:     Right lower leg: Edema present.     Left lower leg: Edema present.  Skin:    General: Skin is warm and dry.  Neurological:     Mental Status: She is alert and oriented to person, place, and time.  Psychiatric:        Mood and Affect: Mood normal.        Behavior: Behavior normal.        Thought Content: Thought content normal.        Judgment: Judgment normal.    BP (!) 154/83 (BP Location: Right Arm)   Pulse 92   Resp 16   Wt 175 lb 12.8 oz (79.7 kg)   LMP 08/27/2017 (Exact Date)   BMI 30.18 kg/m   Past Medical History:  Diagnosis Date   Anemia    Anxiety    Asthma    CHILDHOOD ASTHMA   Depression    Endometriosis    Fibromyalgia    GERD (gastroesophageal reflux disease)    Headache    MIGRAINES   Heart murmur    ASYMPTOMATIC   Rash    scalp    Sepsis (HCC) 2017   Varicose veins of leg with pain, bilateral     Social History   Socioeconomic History   Marital status: Married    Spouse name: Not on file   Number of children: Not on file   Years of education: Not on file   Highest education level: Not on file  Occupational History   Not on file  Tobacco Use   Smoking status: Former    Packs/day: 0.50    Years: 10.00    Pack years: 5.00    Types: Cigarettes    Quit date: 09/16/2013    Years since quitting: 8.0   Smokeless tobacco: Never  Vaping Use   Vaping Use: Every day  Substance and Sexual Activity   Alcohol use: Yes    Alcohol/week: 0.0 standard drinks    Comment: RARE   Drug use: No   Sexual activity: Not Currently    Birth control/protection: Other-see comments    Comment: vasectomy  Other Topics Concern  Not on file  Social History Narrative   Not on file   Social Determinants of Health   Financial Resource Strain: Not on file  Food Insecurity: Not on file  Transportation Needs: Not on file  Physical Activity: Not on file  Stress: Not on file  Social Connections: Not on file  Intimate Partner Violence: Not on file    Past Surgical History:  Procedure Laterality Date   CHOLECYSTECTOMY     COLONOSCOPY WITH PROPOFOL N/A 07/17/2020   Procedure: COLONOSCOPY WITH PROPOFOL;  Surgeon: Pasty Spillers, MD;  Location: [facility];  Service: Endoscopy;  Laterality: N/A;   LAPAROSCOPY N/A 09/21/2017   Procedure: LAPAROSCOPY DIAGNOSTIC WITH BIOPSIES;  Surgeon: Herold Harms, MD;  Location: [facility];  Service: Gynecology;  Laterality: N/A;    Family History  Problem Relation Age of Onset   Depression Mother    Arthritis Mother    Hypertension Mother    Endometriosis Mother    Heart disease Father    Deep vein thrombosis Father    Hypertension Father    Diabetes Father    Ovarian cancer Neg Hx    Breast cancer Neg Hx     No Known Allergies     Latest Ref Rng & Units 09/18/2017     3:03 PM 02/17/2014    6:13 AM 02/16/2014    7:32 PM  CBC  WBC 3.6 - 11.0 K/uL 5.2   15.2   21.0    Hemoglobin 12.0 - 16.0 g/dL 98.3   9.6   38.2    Hematocrit 35.0 - 47.0 % 35.8   31.1   33.1    Platelets 150 - 440 K/uL 326   223   281        CMP     Component Value Date/Time   NA 140 02/17/2014 0613   K 3.2 (L) 02/17/2014 0613   CL 110 (H) 02/17/2014 0613   CO2 24 02/17/2014 0613   GLUCOSE 114 (H) 02/17/2014 0613   BUN 10 02/17/2014 0613   CREATININE 1.02 02/17/2014 0613   CALCIUM 7.6 (L) 02/17/2014 0613   PROT 7.0 02/16/2014 1932   ALBUMIN 2.8 (L) 02/16/2014 1932   AST 22 02/16/2014 1932   ALT 21 02/16/2014 1932   ALKPHOS 189 (H) 02/16/2014 1932   BILITOT 0.3 02/16/2014 1932   GFRNONAA >60 02/17/2014 0613   GFRAA >60 02/17/2014 0613     No results found.     Assessment & Plan:   1. Pain in both lower extremities  Recommend:  The patient has atypical pain symptoms for pure atherosclerotic disease. However, on physical exam there is evidence of vascular disease, given the diminished pulses and the edema associated with venous changes of the legs.  Further investigation of the patient's vascular disease is necessary to determine the relationship of the patient's lower extremity symptoms and the degree of vascular disease.  Noninvasive studies of the will be obtained and the patient will follow up with me to review these studies.  The patient should continue walking and begin a more formal exercise program.    Current Outpatient Medications on File Prior to Visit  Medication Sig Dispense Refill   acetaminophen (TYLENOL 8 HOUR ARTHRITIS PAIN) 650 MG CR tablet Take 1,300 mg by mouth daily.      amphetamine-dextroamphetamine (ADDERALL) 30 MG tablet Take 1 tablet by mouth 2 (two) times daily.     Buprenorphine HCl-Naloxone HCl 8-2 MG FILM      buPROPion (  WELLBUTRIN XL) 300 MG 24 hr tablet Take 1 tablet (300 mg total) by mouth daily. 90 tablet 3   escitalopram  (LEXAPRO) 20 MG tablet TAKE 1 TABLET BY MOUTH EVERY DAY 90 tablet 1   gabapentin (NEURONTIN) 300 MG capsule Take 2 capsules (600 mg total) by mouth in the morning, at noon, in the evening, and at bedtime. 360 capsule 3   ibuprofen (ADVIL,MOTRIN) 200 MG tablet Take 800 mg by mouth 2 (two) times daily as needed (FOR PAIN.).      omeprazole (PRILOSEC) 20 MG capsule Take 20 mg by mouth 2 (two) times daily before a meal.     topiramate (TOPAMAX) 25 MG tablet TAKE 1 TABLET BY MOUTH TWICE A DAY 180 tablet 1   No current facility-administered medications on file prior to visit.    There are no Patient Instructions on file for this visit. No follow-ups on file.   Georgiana SpinnerFallon E Jacquelinne Speak, NP

## 2021-10-15 ENCOUNTER — Encounter (INDEPENDENT_AMBULATORY_CARE_PROVIDER_SITE_OTHER): Payer: Self-pay | Admitting: Nurse Practitioner

## 2021-10-17 ENCOUNTER — Encounter (INDEPENDENT_AMBULATORY_CARE_PROVIDER_SITE_OTHER): Payer: Self-pay | Admitting: Nurse Practitioner

## 2021-10-29 ENCOUNTER — Other Ambulatory Visit (INDEPENDENT_AMBULATORY_CARE_PROVIDER_SITE_OTHER): Payer: Self-pay | Admitting: Nurse Practitioner

## 2021-10-29 DIAGNOSIS — M79604 Pain in right leg: Secondary | ICD-10-CM

## 2021-10-30 ENCOUNTER — Ambulatory Visit (INDEPENDENT_AMBULATORY_CARE_PROVIDER_SITE_OTHER): Payer: 59

## 2021-10-30 ENCOUNTER — Ambulatory Visit (INDEPENDENT_AMBULATORY_CARE_PROVIDER_SITE_OTHER): Payer: 59 | Admitting: Nurse Practitioner

## 2021-10-30 ENCOUNTER — Encounter (INDEPENDENT_AMBULATORY_CARE_PROVIDER_SITE_OTHER): Payer: Self-pay | Admitting: Nurse Practitioner

## 2021-10-30 VITALS — BP 147/81 | HR 89 | Resp 16 | Wt 182.6 lb

## 2021-10-30 DIAGNOSIS — M79604 Pain in right leg: Secondary | ICD-10-CM | POA: Diagnosis not present

## 2021-10-30 DIAGNOSIS — M79605 Pain in left leg: Secondary | ICD-10-CM

## 2021-11-08 ENCOUNTER — Other Ambulatory Visit: Payer: Self-pay | Admitting: Obstetrics and Gynecology

## 2021-11-10 ENCOUNTER — Encounter (INDEPENDENT_AMBULATORY_CARE_PROVIDER_SITE_OTHER): Payer: Self-pay | Admitting: Nurse Practitioner

## 2021-11-11 ENCOUNTER — Ambulatory Visit: Payer: 59 | Admitting: Internal Medicine

## 2021-11-12 ENCOUNTER — Inpatient Hospital Stay: Admission: RE | Admit: 2021-11-12 | Payer: 59 | Source: Ambulatory Visit

## 2021-11-14 ENCOUNTER — Ambulatory Visit: Admission: RE | Admit: 2021-11-14 | Payer: 59 | Source: Home / Self Care | Admitting: Obstetrics and Gynecology

## 2021-11-14 ENCOUNTER — Encounter: Admission: RE | Payer: Self-pay | Source: Home / Self Care

## 2021-11-14 SURGERY — DILATATION AND CURETTAGE /HYSTEROSCOPY
Anesthesia: Choice

## 2021-11-21 ENCOUNTER — Other Ambulatory Visit: Payer: Self-pay | Admitting: Internal Medicine

## 2022-01-09 ENCOUNTER — Other Ambulatory Visit: Payer: Self-pay | Admitting: Internal Medicine

## 2022-03-17 ENCOUNTER — Encounter (INDEPENDENT_AMBULATORY_CARE_PROVIDER_SITE_OTHER): Payer: Self-pay

## 2022-04-07 ENCOUNTER — Other Ambulatory Visit: Payer: Self-pay | Admitting: Internal Medicine

## 2022-04-29 ENCOUNTER — Ambulatory Visit (INDEPENDENT_AMBULATORY_CARE_PROVIDER_SITE_OTHER): Payer: 59 | Admitting: Vascular Surgery

## 2022-07-30 ENCOUNTER — Other Ambulatory Visit: Payer: Self-pay

## 2022-07-30 ENCOUNTER — Ambulatory Visit
Admission: EM | Admit: 2022-07-30 | Discharge: 2022-07-30 | Disposition: A | Discharge status: Home / Self Care | Payer: 59 | Attending: Emergency Medicine | Admitting: Emergency Medicine

## 2022-07-30 ENCOUNTER — Encounter: Payer: Self-pay | Admitting: Emergency Medicine

## 2022-07-30 ENCOUNTER — Encounter: Payer: Self-pay | Admitting: Intensive Care

## 2022-07-30 ENCOUNTER — Emergency Department: Payer: 59

## 2022-07-30 ENCOUNTER — Emergency Department
Admission: EM | Admit: 2022-07-30 | Discharge: 2022-07-31 | Disposition: A | Discharge status: Home / Self Care | Payer: 59 | Attending: Emergency Medicine | Admitting: Emergency Medicine

## 2022-07-30 DIAGNOSIS — R109 Unspecified abdominal pain: Secondary | ICD-10-CM

## 2022-07-30 DIAGNOSIS — R350 Frequency of micturition: Secondary | ICD-10-CM | POA: Diagnosis not present

## 2022-07-30 DIAGNOSIS — R1032 Left lower quadrant pain: Secondary | ICD-10-CM | POA: Diagnosis present

## 2022-07-30 DIAGNOSIS — N889 Noninflammatory disorder of cervix uteri, unspecified: Secondary | ICD-10-CM

## 2022-07-30 DIAGNOSIS — R1084 Generalized abdominal pain: Secondary | ICD-10-CM | POA: Diagnosis not present

## 2022-07-30 DIAGNOSIS — K5900 Constipation, unspecified: Secondary | ICD-10-CM

## 2022-07-30 DIAGNOSIS — R87619 Unspecified abnormal cytological findings in specimens from cervix uteri: Secondary | ICD-10-CM | POA: Diagnosis not present

## 2022-07-30 HISTORY — DX: Attention-deficit hyperactivity disorder, unspecified type: F90.9

## 2022-07-30 LAB — URINALYSIS, ROUTINE W REFLEX MICROSCOPIC
Bilirubin Urine: NEGATIVE
Glucose, UA: NEGATIVE mg/dL
Hgb urine dipstick: NEGATIVE
Ketones, ur: NEGATIVE mg/dL
Leukocytes,Ua: NEGATIVE
Nitrite: NEGATIVE
Protein, ur: NEGATIVE mg/dL
Specific Gravity, Urine: 1.011 (ref 1.005–1.030)
pH: 5 (ref 5.0–8.0)

## 2022-07-30 LAB — COMPREHENSIVE METABOLIC PANEL
ALT: 13 U/L (ref 0–44)
AST: 15 U/L (ref 15–41)
Albumin: 4.2 g/dL (ref 3.5–5.0)
Alkaline Phosphatase: 90 U/L (ref 38–126)
Anion gap: 8 (ref 5–15)
BUN: 13 mg/dL (ref 6–20)
CO2: 27 mmol/L (ref 22–32)
Calcium: 8.8 mg/dL — ABNORMAL LOW (ref 8.9–10.3)
Chloride: 102 mmol/L (ref 98–111)
Creatinine, Ser: 0.77 mg/dL (ref 0.44–1.00)
GFR, Estimated: >60 mL/min (ref >60)
Glucose, Bld: 82 mg/dL (ref 70–99)
Potassium: 3.4 mmol/L — ABNORMAL LOW (ref 3.5–5.1)
Sodium: 137 mmol/L (ref 135–145)
Total Bilirubin: 0.4 mg/dL (ref 0.3–1.2)
Total Protein: 8.4 g/dL — ABNORMAL HIGH (ref 6.5–8.1)

## 2022-07-30 LAB — POCT URINALYSIS DIP (MANUAL ENTRY)
Blood, UA: NEGATIVE
Glucose, UA: NEGATIVE mg/dL
Leukocytes, UA: NEGATIVE
Nitrite, UA: NEGATIVE
Spec Grav, UA: >=1.03 — AB (ref 1.010–1.025)
Urobilinogen, UA: 0.2 E.U./dL
pH, UA: 5.5 (ref 5.0–8.0)

## 2022-07-30 LAB — CBC
HCT: 41.1 % (ref 36.0–46.0)
Hemoglobin: 12.7 g/dL (ref 12.0–15.0)
MCH: 28.2 pg (ref 26.0–34.0)
MCHC: 30.9 g/dL (ref 30.0–36.0)
MCV: 91.3 fL (ref 80.0–100.0)
Platelets: 280 10*3/uL (ref 150–400)
RBC: 4.5 MIL/uL (ref 3.87–5.11)
RDW: 15 % (ref 11.5–15.5)
WBC: 6.4 10*3/uL (ref 4.0–10.5)
nRBC: 0 % (ref 0.0–0.2)

## 2022-07-30 LAB — LIPASE, BLOOD: Lipase: 33 U/L (ref 11–51)

## 2022-07-30 MED ORDER — IOHEXOL 300 MG/ML  SOLN
100.0000 mL | Freq: Once | INTRAMUSCULAR | Status: AC | PRN
Start: 2022-07-30 — End: 2022-07-30
  Administered 2022-07-30: 100 mL via INTRAVENOUS

## 2022-07-30 NOTE — ED Provider Notes (Signed)
[facility]    CSN: [id number] Arrival date & time: 07/30/22  1645      History   Chief Complaint Chief Complaint  Patient presents with   Flank Pain    feeling of discomfort when bladder is full and when I relieve my bladder, back ache & dull ache in stomach/sides, cloudy urine - Entered by patient    HPI Isabel Robinson is a 47 y.o. female. Patient presents with right flank pain x 7 days.  This pain is getting worse and she now has generalized abdominal pain which is worse in the LUQ.  She recently started on Zepbound for weight loss.  She also reports constipation and cannot remember when she last had a bowel movement.  She denies fever, chills, dysuria, hematuria, nausea, vomiting, diarrhea, or other symptoms.  Treatment attempted with   The history is provided by the patient and medical records.    Past Medical History:  Diagnosis Date   Anemia    Anxiety    Asthma    CHILDHOOD ASTHMA   Depression    Endometriosis    Fibromyalgia    GERD (gastroesophageal reflux disease)    Headache    MIGRAINES   Heart murmur    ASYMPTOMATIC   Rash    scalp   Sepsis ([place]) 2017   Varicose veins of leg with pain, bilateral     Patient Active Problem List   Diagnosis Date Noted   Encounter for screening colonoscopy 08/19/2021   Anxiety 07/04/2020   Depression 07/04/2020   Headache(784.0) 07/04/2020   PMB (postmenopausal bleeding) 07/04/2020   Amenorrhea, unspecified 03/29/2020   Endometriosis 10/01/2017   History of cryosurgery 08/27/2017   Cervical stenosis (uterine cervix) 08/27/2017   Family history of endometriosis in first degree relative 08/27/2017   Pelvic pain in female 08/27/2017   Absolute anemia 04/02/2015   Carpal tunnel syndrome 04/02/2015   Headache 04/02/2015   Hereditary and idiopathic neuropathy 04/02/2015   Dysmenorrhea 04/02/2015   Menorrhagia 04/02/2015   History of cervical dysplasia 04/02/2015   Greater trochanteric bursitis 10/31/2014    Migraine 10/02/2014   Sacroiliac joint dysfunction 10/02/2014   Bilateral occipital neuralgia 10/02/2014    Past Surgical History:  Procedure Laterality Date   CHOLECYSTECTOMY     COLONOSCOPY WITH PROPOFOL N/A 07/17/2020   Procedure: COLONOSCOPY WITH PROPOFOL;  Surgeon: Virgel Manifold, MD;  Location: [facility];  Service: Endoscopy;  Laterality: N/A;   LAPAROSCOPY N/A 09/21/2017   Procedure: LAPAROSCOPY DIAGNOSTIC WITH BIOPSIES;  Surgeon: Brayton Mars, MD;  Location: [facility];  Service: Gynecology;  Laterality: N/A;    OB History     Gravida  1   Para  1   Term  1   Preterm      AB      Living  1      SAB      IAB      Ectopic      Multiple      Live Births  1            Home Medications    Prior to Admission medications   Medication Sig Start Date End Date Taking? Authorizing Provider  NON FORMULARY Zepbound injection weight loss once a week injection   Yes [provider]  acetaminophen (TYLENOL 8 HOUR ARTHRITIS PAIN) 650 MG CR tablet Take 1,300 mg by mouth daily.     [provider]  amphetamine-dextroamphetamine (ADDERALL) 30 MG tablet Take 1 tablet  by mouth 2 (two) times daily. 09/05/21   [provider]  Buprenorphine HCl-Naloxone HCl 8-2 MG FILM  06/21/20   [provider]  buPROPion (WELLBUTRIN XL) 300 MG 24 hr tablet TAKE 1 TABLET BY MOUTH EVERY DAY 11/22/21   Cletis Athens, MD  escitalopram (LEXAPRO) 20 MG tablet TAKE 1 TABLET BY MOUTH EVERY DAY 04/07/22   Theresia Lo, NP  gabapentin (NEURONTIN) 300 MG capsule TAKE 2 CAPSULES (600 MG TOTAL) BY MOUTH IN THE MORNING, AT NOON, IN THE EVENING, AND AT BEDTIME. 01/09/22   Cletis Athens, MD  ibuprofen (ADVIL,MOTRIN) 200 MG tablet Take 800 mg by mouth 2 (two) times daily as needed (FOR PAIN.).     [provider]  omeprazole (PRILOSEC) 20 MG capsule Take 20 mg by mouth 2 (two) times daily before a meal.    [provider]  topiramate  (TOPAMAX) 25 MG tablet TAKE 1 TABLET BY MOUTH TWICE A DAY 04/07/22   Theresia Lo, NP    Family History Family History  Problem Relation Age of Onset   Depression Mother    Arthritis Mother    Hypertension Mother    Endometriosis Mother    Heart disease Father    Deep vein thrombosis Father    Hypertension Father    Diabetes Father    Ovarian cancer Neg Hx    Breast cancer Neg Hx     Social History Social History   Tobacco Use   Smoking status: Former    Packs/day: 0.50    Years: 10.00    Total pack years: 5.00    Types: Cigarettes    Quit date: 09/16/2013    Years since quitting: 8.8   Smokeless tobacco: Never  Vaping Use   Vaping Use: Every day  Substance Use Topics   Alcohol use: Yes    Alcohol/week: 0.0 standard drinks of alcohol    Comment: RARE   Drug use: No     Allergies   Patient has no known allergies.   Review of Systems Review of Systems  Constitutional:  Negative for chills and fever.  Respiratory:  Negative for cough and shortness of breath.   Cardiovascular:  Negative for chest pain and palpitations.  Gastrointestinal:  Positive for abdominal pain and constipation. Negative for diarrhea, nausea and vomiting.  Genitourinary:  Negative for dysuria and hematuria.  All other systems reviewed and are negative.    Physical Exam Triage Vital Signs ED Triage Vitals [07/30/22 1744]  Enc Vitals Group     BP (!) 136/90     Pulse Rate 87     Resp 18     Temp 98.5 F (36.9 C)     Temp Source Oral     SpO2 97 %     Weight      Height      Head Circumference      Peak Flow      Pain Score      Pain Loc      Pain Edu?      Excl. in [place]?    No data found.  Updated Vital Signs BP (!) 136/90 (BP Location: Left Arm)   Pulse 87   Temp 98.5 F (36.9 C) (Oral)   Resp 18   LMP 08/27/2017 (Exact Date)   SpO2 97%   Visual Acuity Right Eye Distance:   Left Eye Distance:   Bilateral Distance:    Right Eye Near:   Left Eye Near:     Bilateral  Near:     Physical Exam Vitals and nursing note reviewed.  Constitutional:      General: She is not in acute distress.    Appearance: Normal appearance. She is well-developed. She is not ill-appearing.  HENT:     Mouth/Throat:     Mouth: Mucous membranes are moist.  Cardiovascular:     Rate and Rhythm: Normal rate and regular rhythm.     Heart sounds: Normal heart sounds.  Pulmonary:     Effort: Pulmonary effort is normal. No respiratory distress.     Breath sounds: Normal breath sounds.  Abdominal:     General: Bowel sounds are normal.     Palpations: Abdomen is soft.     Tenderness: There is generalized abdominal tenderness and tenderness in the left upper quadrant. There is right CVA tenderness. There is no left CVA tenderness, guarding or rebound.     Comments: Generalized abdominal pain, worse in LUQ.  No rebound or guarding.  Right CVAT.   Musculoskeletal:     Cervical back: Neck supple.  Skin:    General: Skin is warm and dry.  Neurological:     Mental Status: She is alert.  Psychiatric:        Mood and Affect: Mood normal.        Behavior: Behavior normal.      UC Treatments / Results  Labs (all labs ordered are listed, but only abnormal results are displayed) Labs Reviewed  POCT URINALYSIS DIP (MANUAL ENTRY) - Abnormal; Notable for the following components:      Result Value   Clarity, UA cloudy (*)    Bilirubin, UA small (*)    Ketones, POC UA trace (5) (*)    Spec Grav, UA >=1.030 (*)    Protein Ur, POC trace (*)    All other components within normal limits    EKG   Radiology No results found.  Procedures Procedures (including critical care time)  Medications Ordered in UC Medications - No data to display  Initial Impression / Assessment and Plan / UC Course  I have reviewed the triage vital signs and the nursing notes.  Pertinent labs & imaging results that were available during my care of the patient were reviewed by me and  considered in my medical decision making (see chart for details).    Abdominal pain, Flank pain, Constipation.  Afebrile, VSS.    Patient is tender across her abdomen but worse in the left upper quadrant.  She also has right CVAT.  Urine is cloudy but negative for infection.  Patient recently started Zepbound for weight loss.  She also reports she cannot remember when she last had a bowel movement.  Sending her to the ED for evaluation.  She feels stable to drive herself and declines EMS.    Final Clinical Impressions(s) / UC Diagnoses   Final diagnoses:  Generalized abdominal pain  Flank pain, acute  Constipation, unspecified constipation type     Discharge Instructions      Go to the emergency department for evaluation of your abdominal pain and flank pain.         ED Prescriptions   None    I have reviewed the PDMP during this encounter.   Sharion Balloon, NP 07/30/22 [phone]

## 2022-07-30 NOTE — ED Provider Notes (Signed)
[facility] Provider Note    Event Date/Time   First MD Initiated Contact with Patient 07/30/22 2048     (approximate)   History   Abdominal Pain   HPI  Isabel Robinson is a 47 y.o. female who presents to the emergency department today because of concerns for abdominal pain.  Patient started having symptoms roughly 1 week ago.  Discomfort initially in the left lower quadrant and suprapubic area.  She states that has now gone up to her left flank but she is also now having right flank pain.  She did think she might have a urinary tract infection because her urine was cloudy and she had increased urinary frequency.  She states she has had urinary tract infections in the past that have caused kidney infections.  She denies any fevers. Denies any nausea, vomiting, diarrhea.      Physical Exam   Triage Vital Signs: ED Triage Vitals  Enc Vitals Group     BP 07/30/22 1825 134/82     Pulse Rate 07/30/22 1825 80     Resp 07/30/22 1825 16     Temp 07/30/22 1825 98.3 F (36.8 C)     Temp Source 07/30/22 1825 Oral     SpO2 07/30/22 1825 100 %     Weight 07/30/22 1826 170 lb (77.1 kg)     Height 07/30/22 1826 '5\' 4"'$  (1.626 m)     Head Circumference --      Peak Flow --      Pain Score 07/30/22 1826 3     Pain Loc --      Pain Edu? --      Excl. in [place]? --     Most recent vital signs: Vitals:   07/30/22 1825  BP: 134/82  Pulse: 80  Resp: 16  Temp: 98.3 F (36.8 C)  SpO2: 100%   General: Awake, alert, oriented. CV:  Good peripheral perfusion. Regular rate and rhythm. Resp:  Normal effort. Lungs clear. Abd:  No distention. Minimal tenderness in suprapubic area.   ED Results / Procedures / Treatments   Labs (all labs ordered are listed, but only abnormal results are displayed) Labs Reviewed  COMPREHENSIVE METABOLIC PANEL - Abnormal; Notable for the following components:      Result Value   Potassium 3.4 (*)    Calcium 8.8 (*)    Total Protein  8.4 (*)    All other components within normal limits  URINALYSIS, ROUTINE W REFLEX MICROSCOPIC - Abnormal; Notable for the following components:   Color, Urine YELLOW (*)    APPearance CLEAR (*)    All other components within normal limits  LIPASE, BLOOD  CBC     EKG  None   RADIOLOGY I independently interpreted and visualized the CT abd/pel. My interpretation: abnormality around cervix. No free air. Radiology interpretation:  IMPRESSION:  1. Multi-cystic mass about the cervix measuring 5.7 x 3.5 cm. Mild  adjacent fat stranding. Findings could be due to cervicitis though  underlying mass is not excluded. Recommend pelvic ultrasound and  direct visualization.     PROCEDURES:  Critical Care performed: No    MEDICATIONS ORDERED IN ED: Medications - No data to display   IMPRESSION / MDM / [place] / ED COURSE  I reviewed the triage vital signs and the nursing notes.  Differential diagnosis includes, but is not limited to, kidney stone, UTI, kidney infection, diverticulitis.  Patient's presentation is most consistent with acute presentation with potential threat to life or bodily function.   Patient presented to the emergency department today because of concerns for abdominal pain.  On exam she had some slight suprapubic tenderness.  Patient was afebrile here.  No concerning leukocytosis on the blood work.  Did obtain a CT scan given concern for possible kidney infection, kidney stone or diverticulitis.  While this did not show any of those etiologies did raise concern for cystic lesion in the patient's cervix.  I did order an ultrasound.  I discussed this finding with the patient and desire to obtain ultrasound for further evaluation.  However patient stated she would rather not have the ultrasound performed tonight.  She would feel more comfortable following up with her OB/GYN and getting it performed through their office.  She feels  fairly confident she would be able to do this later today.  Did discuss that patient is always welcome to return to the emergency department if she is not able to be worked in by her OB/GYN. Discussed concern for possible mass versus infection.      FINAL CLINICAL IMPRESSION(S) / ED DIAGNOSES   Final diagnoses:  Abdominal pain, unspecified abdominal location  Cervix abnormality      Note:  This document was prepared using Dragon voice recognition software and may include unintentional dictation errors.    Nance Pear, MD 07/31/22 0030

## 2022-07-30 NOTE — ED Triage Notes (Signed)
C/o nausea and cloudy urine X1 week. Now having discomfort when urinating, left side abdominal pain, and left back pain.   Takes Zepbound once a week. Has been taking it 8 weeks.

## 2022-07-30 NOTE — ED Triage Notes (Addendum)
Patient in office today c/o flank and back pain x 7d starting with nausea then a dull aching  pain that has worsen over the days.  OTC: Ibuprofen and tylenol  Denies: fever, vomiting and HA

## 2022-07-30 NOTE — ED Notes (Signed)
Patient is being discharged from the Urgent Care and sent to the Emergency Department via POV . Per [employer], patient is in need of higher level of care due to abdominal pain. Patient is aware and verbalizes understanding of plan of care.  Vitals:   07/30/22 1744  BP: (!) 136/90  Pulse: 87  Resp: 18  Temp: 98.5 F (36.9 C)  SpO2: 97%

## 2022-07-30 NOTE — Discharge Instructions (Addendum)
Go to the emergency department for evaluation of your abdominal pain and flank pain.

## 2022-07-31 NOTE — ED Notes (Signed)
Pt A&O x4, no obvious distress noted, respirations regular/unlabored. Pt verbalizes understanding of discharge instructions. Pt able to ambulate from ED independently.   

## 2022-07-31 NOTE — Discharge Instructions (Signed)
Please contact your ob/gyn today for [place] and further evaluation.

## 2022-11-18 ENCOUNTER — Other Ambulatory Visit: Payer: Self-pay | Admitting: Nurse Practitioner

## 2023-08-24 IMAGING — US US EXTREM LOW VENOUS*L*
1 series · 15 of 24 positions shown · non-contrast
Comparison: None.

CLINICAL DATA: Pain and swelling

EXAM:
LEFT LOWER EXTREMITY VENOUS DOPPLER ULTRASOUND
TECHNIQUE: Gray-scale sonography with compression, as well as color and duplex
ultrasound, were performed to evaluate the deep venous system(s)
from the level of the common femoral vein through the popliteal and
proximal calf veins.

[Series 1: us venous imag bi/left/(id) · portal-venous · 15 of 31 slices shown]
[im 1/31]
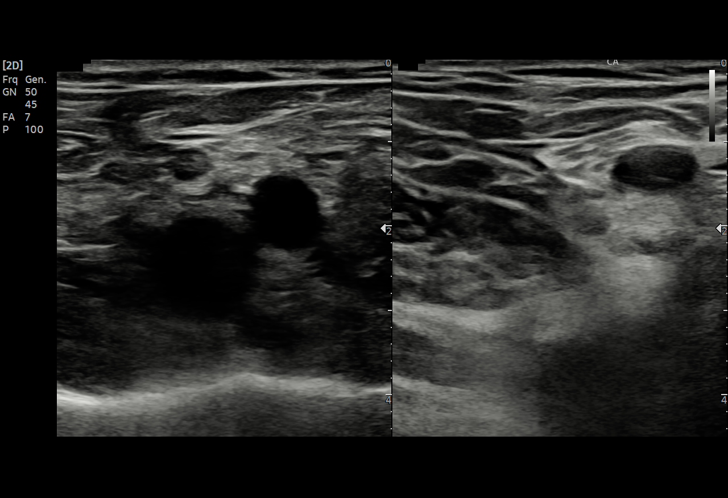
[im 3/31]
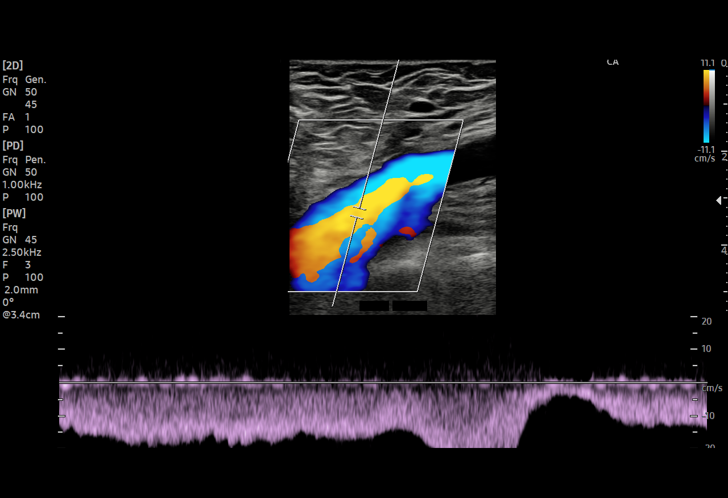
[im 6/31]
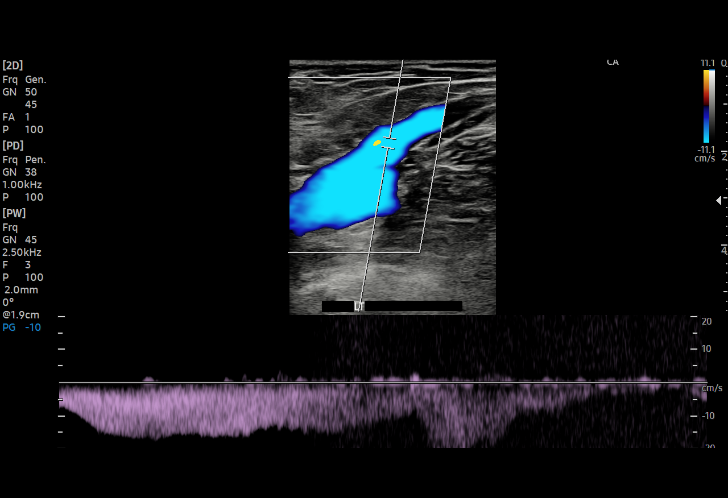
[im 7/31]
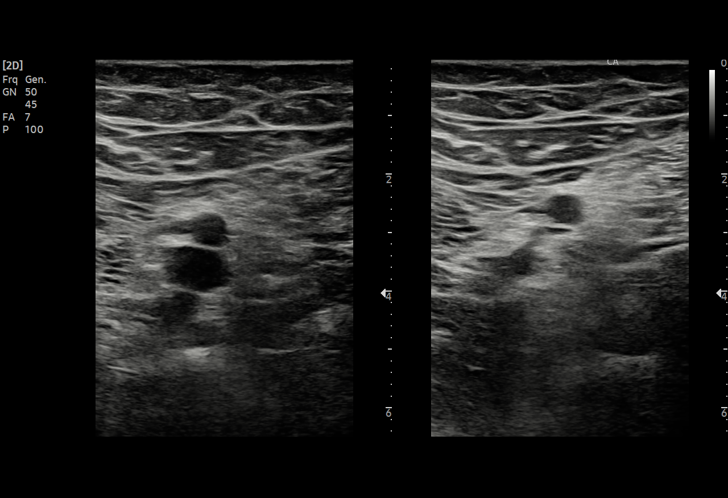
[im 10/31]
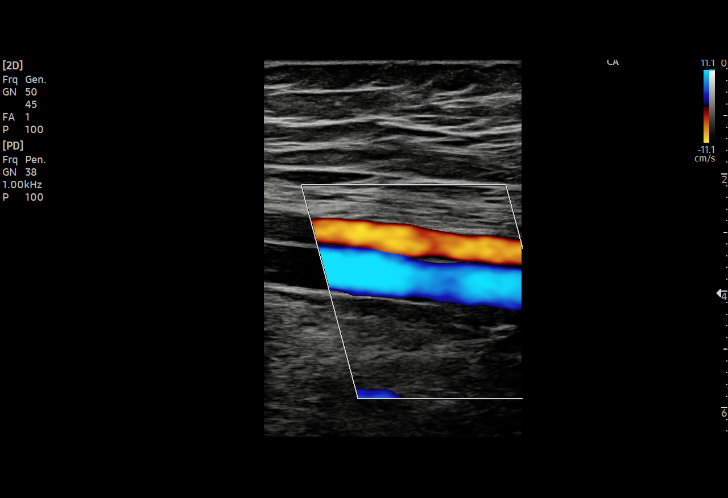
[im 11/31]
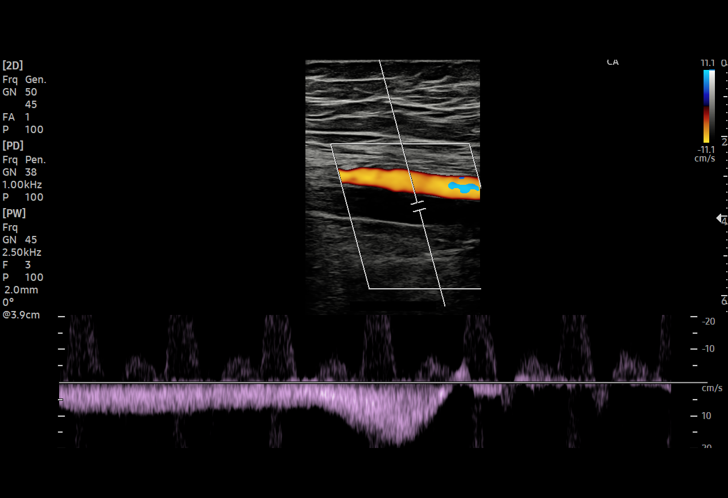
[im 14/31]
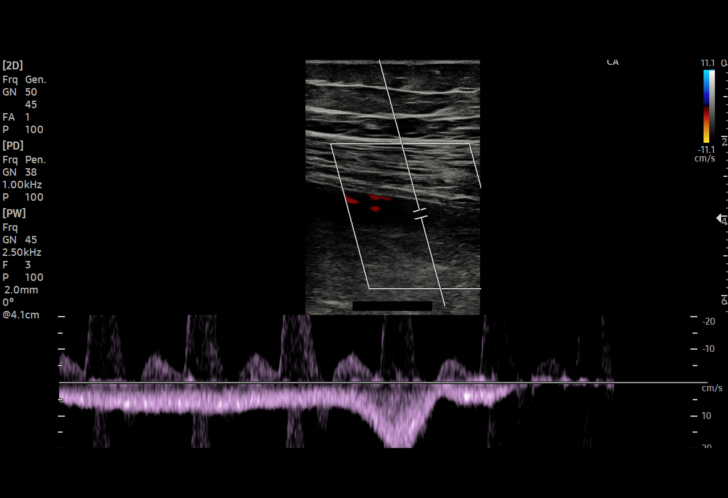
[im 16/31]
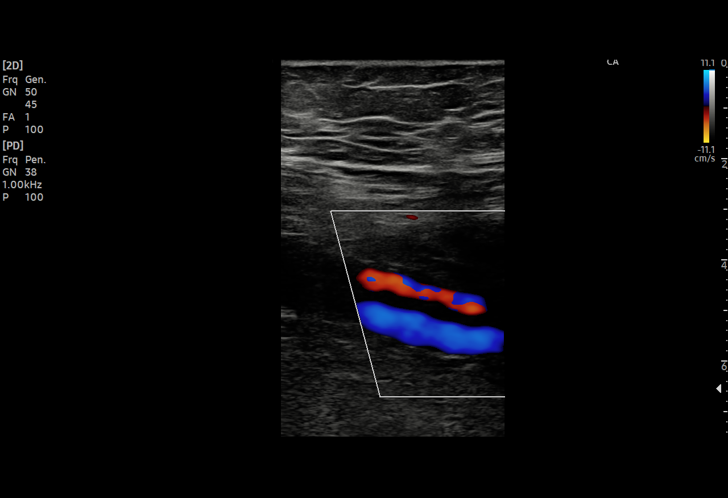
[im 17/31]
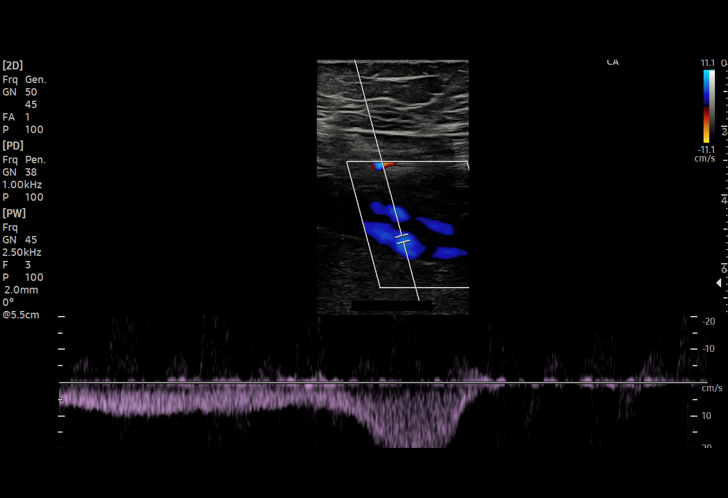
[im 20/31]
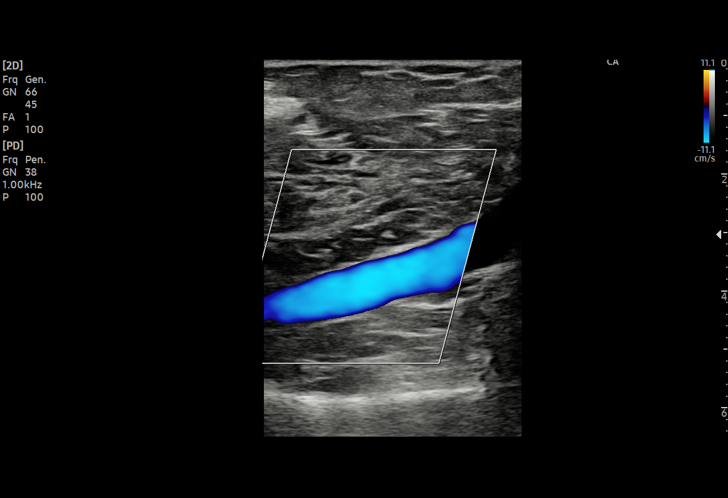
[im 21/31]
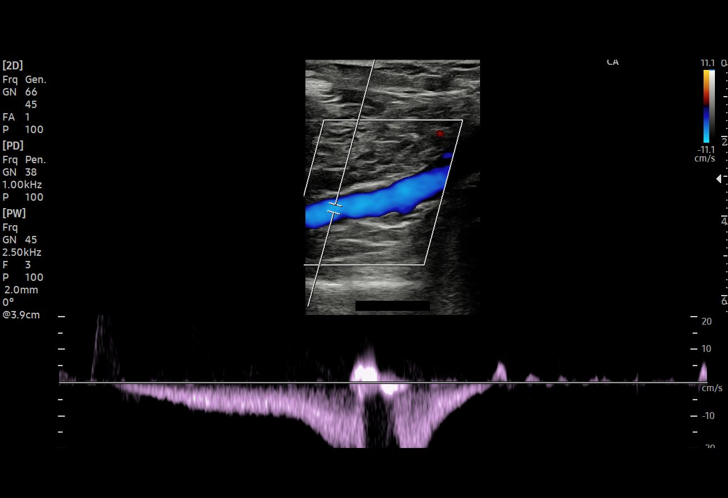
[im 24/31]
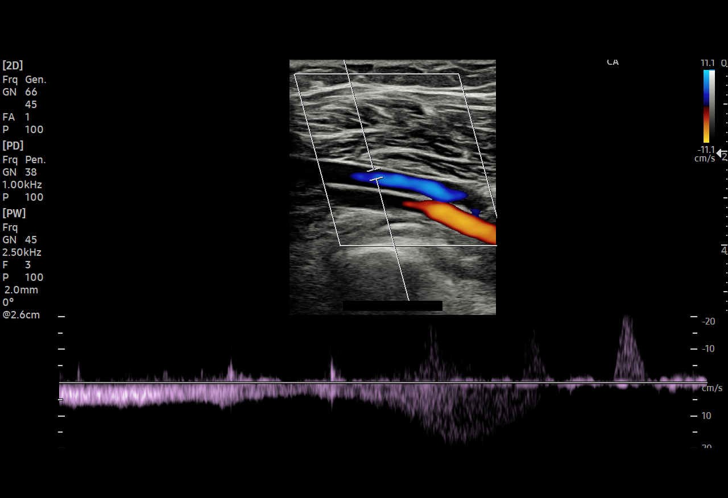
[im 27/31]
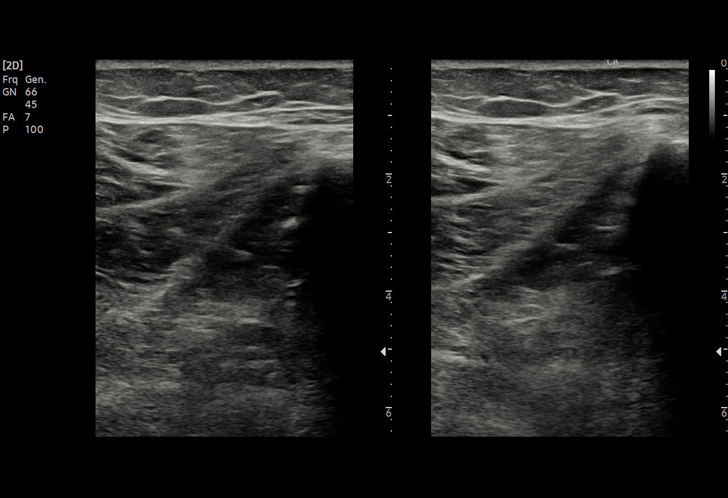
[im 28/31]
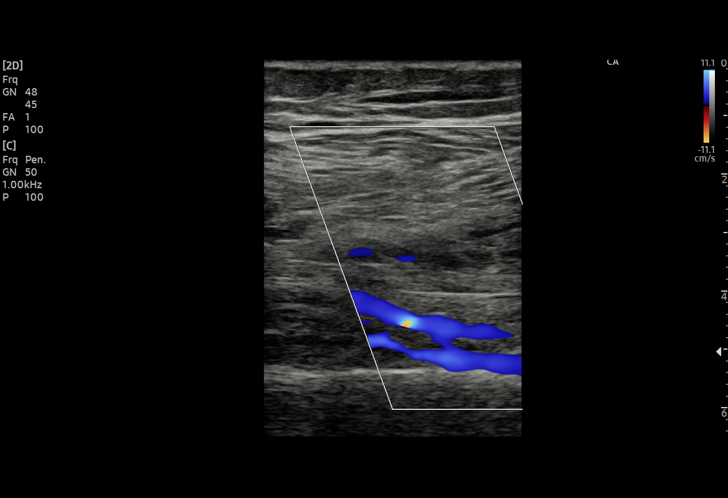
[im 31/31]
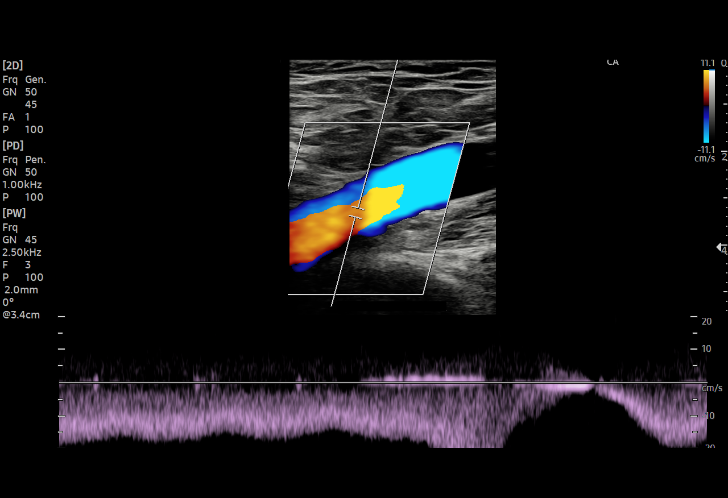

[15 of 24 positions shown; findings below may reference images not displayed]

FINDINGS: VENOUS

Normal compressibility of the common femoral, superficial femoral,
and popliteal veins, as well as the visualized calf veins.
Visualized portions of profunda femoral vein and great saphenous
vein unremarkable. No filling defects to suggest DVT on grayscale or
color Doppler imaging. Doppler waveforms show normal direction of
venous flow, normal respiratory plasticity and response to
augmentation.

Limited views of the contralateral common femoral vein are
unremarkable.

OTHER

None.

Limitations: none
IMPRESSION: Negative.

## 2023-12-01 ENCOUNTER — Encounter: Payer: Self-pay | Admitting: Obstetrics and Gynecology

## 2023-12-08 ENCOUNTER — Other Ambulatory Visit: Payer: Self-pay | Admitting: Obstetrics and Gynecology

## 2023-12-08 DIAGNOSIS — N6002 Solitary cyst of left breast: Secondary | ICD-10-CM

## 2023-12-28 ENCOUNTER — Ambulatory Visit
Admission: RE | Admit: 2023-12-28 | Discharge: 2023-12-28 | Disposition: A | Discharge status: Home / Self Care | Source: Ambulatory Visit | Attending: Obstetrics and Gynecology | Admitting: Obstetrics and Gynecology

## 2023-12-28 DIAGNOSIS — N6002 Solitary cyst of left breast: Secondary | ICD-10-CM | POA: Diagnosis present
